# Patient Record
Sex: Male | Born: 1985 | Race: White | Hispanic: No | Smoking: Light tobacco smoker
Health system: Southern US, Community
[De-identification: ages and names within clinical notes are randomized; demographics above are authoritative.]

## PROBLEM LIST (undated history)

## (undated) DIAGNOSIS — C6290 Malignant neoplasm of unspecified testis, unspecified whether descended or undescended: Secondary | ICD-10-CM

## (undated) HISTORY — PX: TESTICLE REMOVAL: SHX68

---

## 2019-01-23 ENCOUNTER — Emergency Department (HOSPITAL_COMMUNITY)
Admission: EM | Admit: 2019-01-23 | Discharge: 2019-01-23 | Disposition: A | Discharge status: Home / Self Care | Payer: Medicaid - Out of State | Attending: Emergency Medicine | Admitting: Emergency Medicine

## 2019-01-23 ENCOUNTER — Other Ambulatory Visit: Payer: Self-pay

## 2019-01-23 ENCOUNTER — Encounter (HOSPITAL_COMMUNITY): Payer: Self-pay | Admitting: Emergency Medicine

## 2019-01-23 DIAGNOSIS — X58XXXA Exposure to other specified factors, initial encounter: Secondary | ICD-10-CM | POA: Diagnosis not present

## 2019-01-23 DIAGNOSIS — F17210 Nicotine dependence, cigarettes, uncomplicated: Secondary | ICD-10-CM | POA: Diagnosis not present

## 2019-01-23 DIAGNOSIS — Y929 Unspecified place or not applicable: Secondary | ICD-10-CM | POA: Diagnosis not present

## 2019-01-23 DIAGNOSIS — S3021XA Contusion of penis, initial encounter: Secondary | ICD-10-CM | POA: Diagnosis not present

## 2019-01-23 DIAGNOSIS — N4889 Other specified disorders of penis: Secondary | ICD-10-CM

## 2019-01-23 DIAGNOSIS — S3994XA Unspecified injury of external genitals, initial encounter: Secondary | ICD-10-CM | POA: Diagnosis present

## 2019-01-23 DIAGNOSIS — Y9389 Activity, other specified: Secondary | ICD-10-CM | POA: Insufficient documentation

## 2019-01-23 DIAGNOSIS — Y999 Unspecified external cause status: Secondary | ICD-10-CM | POA: Diagnosis not present

## 2019-01-23 LAB — URINALYSIS, ROUTINE W REFLEX MICROSCOPIC
Bilirubin Urine: NEGATIVE
Glucose, UA: NEGATIVE mg/dL
Hgb urine dipstick: NEGATIVE
Ketones, ur: NEGATIVE mg/dL
Leukocytes,Ua: NEGATIVE
Nitrite: NEGATIVE
Protein, ur: NEGATIVE mg/dL
Specific Gravity, Urine: 1.019 (ref 1.005–1.030)
pH: 6 (ref 5.0–8.0)

## 2019-01-23 NOTE — ED Provider Notes (Signed)
  [facility] EMERGENCY DEPARTMENT Provider Note   CSN: [id number] Arrival date & time: 01/23/19  1126     History   Chief Complaint No chief complaint on file.   HPI Ricky Mack is a 33 y.o. male.     Pt complains of swelling and bruising to his penis after rough sex.   The history is provided by the patient. No language interpreter was used.    History reviewed. No pertinent past medical history.  There are no active problems to display for this patient.   Past Surgical History:  Procedure Laterality Date  . TESTICLE REMOVAL Right         Home Medications    Prior to Admission medications   Not on File    Family History No family history on file.  Social History Social History   Tobacco Use  . Smoking status: Light Tobacco Smoker    Types: Cigarettes  . Smokeless tobacco: Never Used  Substance Use Topics  . Alcohol use: Yes    Comment: occ  . Drug use: Not Currently     Allergies   Keflex [cephalexin]   Review of Systems Review of Systems  All other systems reviewed and are negative.    Physical Exam Updated Vital Signs BP 126/80 (BP Location: Right Arm)   Pulse 73   Temp 97.9 F (36.6 C) (Oral)   Resp 16   Ht 6\' 2"  (1.88 m)   Wt 136.1 kg   SpO2 97%   BMI 38.52 kg/m   Physical Exam Vitals signs reviewed.  Cardiovascular:     Rate and Rhythm: Normal rate.  Pulmonary:     Effort: Pulmonary effort is normal.  Abdominal:     General: Abdomen is flat.  Genitourinary:    Comments: Bruised proximal shaft of penis, nontender, no bleeeding  Musculoskeletal: Normal range of motion.  Skin:    General: Skin is warm.  Neurological:     General: No focal deficit present.  Psychiatric:        Mood and Affect: Mood normal.      ED Treatments / Results  Labs (all labs ordered are listed, but only abnormal results are displayed) Labs Reviewed  URINALYSIS, ROUTINE W REFLEX MICROSCOPIC    EKG None  Radiology No results  found.  Procedures Procedures (including critical care time)  Medications Ordered in ED Medications - No data to display   Initial Impression / Assessment and Plan / ED Course  I have reviewed the triage vital signs and the nursing notes.  Pertinent labs & imaging results that were available during my care of the patient were reviewed by me and considered in my medical decision making (see chart for details).        MDM  ua negative.  I spoke to Dr. Alinda Money urology.  Pt did not have sudden pop, no loss of erection.  Minimal pain.   I doubt penile fracture.  Pt advised to follow up with Urology if any problems.   Final Clinical Impressions(s) / ED Diagnoses   Final diagnoses:  Penile pain    ED Discharge Orders    None    An After Visit Summary was printed and given to the patient.    Fransico Meadow, PA-C 01/23/19 1709    Fredia Sorrow, MD 01/24/19 [phone]

## 2019-01-23 NOTE — ED Triage Notes (Signed)
Pt states his penis is black and blue. States "I had a little too much fun last night."  Denies any difficulty urinating.

## 2019-01-23 NOTE — ED Notes (Signed)
Pt is aware a urine sample is needed, pt stated he cannot go at this time. Urinal and call bell at bedside.

## 2019-01-23 NOTE — Discharge Instructions (Addendum)
Follow up with Urology if any difficulty with intercourse or any curving of penis.   No sex x 1 week.  Ice 20 minutes 4 times a day today  tylenol for pain

## 2019-03-26 ENCOUNTER — Emergency Department (HOSPITAL_COMMUNITY)
Admission: EM | Admit: 2019-03-26 | Discharge: 2019-03-26 | Disposition: A | Discharge status: Home / Self Care | Payer: Medicaid - Out of State | Attending: Emergency Medicine | Admitting: Emergency Medicine

## 2019-03-26 ENCOUNTER — Other Ambulatory Visit: Payer: Self-pay

## 2019-03-26 ENCOUNTER — Encounter (HOSPITAL_COMMUNITY): Payer: Self-pay | Admitting: Emergency Medicine

## 2019-03-26 ENCOUNTER — Emergency Department (HOSPITAL_COMMUNITY): Payer: Medicaid - Out of State

## 2019-03-26 DIAGNOSIS — Z8547 Personal history of malignant neoplasm of testis: Secondary | ICD-10-CM | POA: Diagnosis not present

## 2019-03-26 DIAGNOSIS — M79644 Pain in right finger(s): Secondary | ICD-10-CM | POA: Diagnosis present

## 2019-03-26 DIAGNOSIS — L03011 Cellulitis of right finger: Secondary | ICD-10-CM | POA: Insufficient documentation

## 2019-03-26 DIAGNOSIS — F17210 Nicotine dependence, cigarettes, uncomplicated: Secondary | ICD-10-CM | POA: Insufficient documentation

## 2019-03-26 DIAGNOSIS — M779 Enthesopathy, unspecified: Secondary | ICD-10-CM | POA: Insufficient documentation

## 2019-03-26 DIAGNOSIS — M778 Other enthesopathies, not elsewhere classified: Secondary | ICD-10-CM

## 2019-03-26 HISTORY — DX: Malignant neoplasm of unspecified testis, unspecified whether descended or undescended: C62.90

## 2019-03-26 MED ORDER — SULFAMETHOXAZOLE-TRIMETHOPRIM 800-160 MG PO TABS
1.0000 | ORAL_TABLET | Freq: Once | ORAL | Status: AC
  Administered 2019-03-26: 18:00:00 1 via ORAL
  Filled 2019-03-26: qty 1

## 2019-03-26 MED ORDER — SULFAMETHOXAZOLE-TRIMETHOPRIM 800-160 MG PO TABS: 1.0000 | ORAL_TABLET | Freq: Two times a day (BID) | ORAL | 0 refills | Status: AC

## 2019-03-26 MED ORDER — LIDOCAINE HCL (PF) 1 % IJ SOLN
INTRAMUSCULAR | Status: AC
  Administered 2019-03-26: 18:00:00
  Filled 2019-03-26: qty 5

## 2019-03-26 MED ORDER — ACETAMINOPHEN 500 MG PO TABS
1000.0000 mg | ORAL_TABLET | Freq: Once | ORAL | Status: AC
  Administered 2019-03-26: 1000 mg via ORAL
  Filled 2019-03-26: qty 2

## 2019-03-26 MED ORDER — ONDANSETRON HCL 4 MG PO TABS
4.0000 mg | ORAL_TABLET | Freq: Once | ORAL | Status: AC
  Administered 2019-03-26: 4 mg via ORAL
  Filled 2019-03-26: qty 1

## 2019-03-26 MED ORDER — IBUPROFEN 400 MG PO TABS
400.0000 mg | ORAL_TABLET | Freq: Once | ORAL | Status: AC
  Administered 2019-03-26: 18:00:00 400 mg via ORAL
  Filled 2019-03-26: qty 1

## 2019-03-26 MED ORDER — POVIDONE-IODINE 10 % EX SOLN
CUTANEOUS | Status: AC
  Administered 2019-03-26: 18:00:00
  Filled 2019-03-26: qty 15

## 2019-03-26 NOTE — ED Triage Notes (Signed)
Pain and reddness to RT middle finger x 3 days after a fingernail broke off.  Pain to LT shoulder w/ movement x 2 weeks.

## 2019-03-26 NOTE — ED Provider Notes (Signed)
[facility] Provider Note   CSN: [id number] Arrival date & time: 03/26/19  1609     History   Chief Complaint Chief Complaint  Patient presents with  . Hand Pain    HPI Ricky Mack is a 33 y.o. male.     Patient is a 33 year old male who presents to the emergency department with complaint of pain of the right middle finger and left shoulder pain.  The patient states that about 3 to 4 days ago he pulled a fingernail off that went rather deep.  He also says that he pulled a piece of the cuticle out.  He now has swelling and redness of the tip of the finger.  He says he has pain that is rather severe at the tip of the finger.  He is not had any fever or chills to be reported.  No red streaks going up the hand.  The patient denies any pus like drainage.  The patient reports a two-week history of pain with movement of the left shoulder.  He says it started when he got down on the ground to do something, and when he attempted to get up he had pain of his shoulder.  He says that he does a lot of repetitive movement with his shoulders and upper extremities.  He is not had any direct injury.  He has not had any falls, or been hit in the shoulder area.  He is not had any hot joints as far as the shoulders are concerned.  No recent operations or procedures involving the left shoulder.  He presents now for assistance with this pain.  The history is provided by the patient.  Hand Pain Pertinent negatives include no chest pain, no abdominal pain and no shortness of breath.    Past Medical History:  Diagnosis Date  . 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   Past Surgical History:  Procedure Laterality Date  . TESTICLE REMOVAL Right         Home Medications    Prior to Admission medications   Not on File    Family History No family history on file.  Social History Social History   Tobacco Use  . Smoking  status: Light Tobacco Smoker    Types: Cigarettes  . Smokeless tobacco: Never Used  Substance Use Topics  . Alcohol use: Yes    Comment: occ  . Drug use: Not Currently     Allergies   Keflex [cephalexin]   Review of Systems Review of Systems  Constitutional: Negative for activity change and appetite change.  HENT: Negative for congestion, ear discharge, ear pain, facial swelling, nosebleeds, rhinorrhea, sneezing and tinnitus.   Eyes: Negative for photophobia, pain and discharge.  Respiratory: Negative for cough, choking, shortness of breath and wheezing.   Cardiovascular: Negative for chest pain, palpitations and leg swelling.  Gastrointestinal: Negative for abdominal pain, blood in stool, constipation, diarrhea, nausea and vomiting.  Genitourinary: Negative for difficulty urinating, dysuria, flank pain, frequency and hematuria.  Musculoskeletal: Positive for arthralgias. Negative for back pain, gait problem, myalgias and neck pain.       Finger pain  Skin: Negative for color change, rash and wound.  Neurological: Negative for dizziness, seizures, syncope, facial asymmetry, speech difficulty, weakness and numbness.  Hematological: Negative for adenopathy. Does not bruise/bleed easily.  Psychiatric/Behavioral: Negative for agitation, confusion, hallucinations, self-injury and suicidal ideas. The patient is not nervous/anxious.  Physical Exam Updated Vital Signs BP 126/86 (BP Location: Right Arm)   Pulse 86   Temp 98.1 F (36.7 C) (Oral)   Resp 18   Ht 6\' 2"  (1.88 m)   Wt 135.2 kg   SpO2 98%   BMI 38.26 kg/m   Physical Exam Vitals signs and nursing note reviewed.  Constitutional:      Appearance: He is well-developed. He is not toxic-appearing.  HENT:     Head: Normocephalic.     Right Ear: Tympanic membrane and external ear normal.     Left Ear: Tympanic membrane and external ear normal.  Eyes:     General: Lids are normal.     Pupils: Pupils are equal,  round, and reactive to light.  Neck:     Musculoskeletal: Normal range of motion and neck supple.     Vascular: No carotid bruit.  Cardiovascular:     Rate and Rhythm: Normal rate and regular rhythm.     Pulses: Normal pulses.     Heart sounds: Normal heart sounds.  Pulmonary:     Effort: No respiratory distress.     Breath sounds: Normal breath sounds.  Abdominal:     General: Bowel sounds are normal.     Palpations: Abdomen is soft.     Tenderness: There is no abdominal tenderness. There is no guarding.  Musculoskeletal: Normal range of motion.     Comments: Patient noted to have a paronychia of the right middle finger.  There is no drainage at this time.  There is redness and tenderness of the tip of the finger.  There is full range of motion of the fingers of the right hand.  There is pain with attempted range of motion of the left shoulder.  There is no evidence for dislocation.  There is no hot joint appreciated.  There is full range of motion of the left elbow, wrist and fingers.  The radial pulses 2+.  Capillary refill is less than 2 seconds.  Lymphadenopathy:     Head:     Right side of head: No submandibular adenopathy.     Left side of head: No submandibular adenopathy.     Cervical: No cervical adenopathy.  Skin:    General: Skin is warm and dry.  Neurological:     Mental Status: He is alert and oriented to person, place, and time.     Cranial Nerves: No cranial nerve deficit.     Sensory: No sensory deficit.  Psychiatric:        Speech: Speech normal.      ED Treatments / Results  Labs (all labs ordered are listed, but only abnormal results are displayed) Labs Reviewed - No data to display  EKG None  Radiology No results found.  Procedures Drain paronychia  Date/Time: 03/26/2019 5:33 PM Performed by: Lily Kocher, PA-C Authorized by: Lily Kocher, PA-C  Consent: Verbal consent obtained. Risks and benefits: risks, benefits and alternatives were  discussed Consent given by: patient Patient understanding: patient states understanding of the procedure being performed Required items: required blood products, implants, devices, and special equipment available Patient identity confirmed: arm band Time out: Immediately prior to procedure a "time out" was called to verify the correct patient, procedure, equipment, support staff and site/side marked as required. Preparation: Patient was prepped and draped in the usual sterile fashion. Local anesthesia used: yes Anesthesia: digital block  Anesthesia: Local anesthesia used: yes Local Anesthetic: lidocaine 1% without epinephrine  Sedation: Patient sedated: no  Patient tolerance: patient tolerated the procedure well with no immediate complications    (including critical care time)  Medications Ordered in ED Medications  lidocaine (PF) (XYLOCAINE) 1 % injection (has no administration in time range)  povidone-iodine (BETADINE) 10 % external solution (has no administration in time range)     Initial Impression / Assessment and Plan / ED Course  I have reviewed the triage vital signs and the nursing notes.  Pertinent labs & imaging results that were available during my care of the patient were reviewed by me and considered in my medical decision making (see chart for details).          Final Clinical Impressions(s) / ED Diagnoses MDM  Vital signs within normal limits.  Pulse oximetry is 98% on room air.  Within normal limits by my interpretation.  Patient has a paronychia of the middle finger of the right hand.  Incision and drainage carried out.  X-ray of the left shoulder shows no fracture, no dislocation, and no arthropathy.  Patient is asked to use Tylenol and ibuprofen for the soreness.  Patient is referred to orthopedics for orthopedic evaluation of this increasing pain.   Final diagnoses:  Acute paronychia of finger, right  Left shoulder tendonitis    ED  Discharge Orders         Ordered    sulfamethoxazole-trimethoprim (BACTRIM DS) 800-160 MG tablet  2 times daily     03/26/19 1745           Lily Kocher, PA-C 03/26/19 1747    Varney Biles, MD 03/28/19 2259

## 2019-03-26 NOTE — Discharge Instructions (Addendum)
The x-ray of your shoulder is negative for fracture, dislocation, or calcium and your tendons.  Your examination is consistent with a tendinitis involving the shoulder.  Please use 1000 mg of Tylenol and 400 mg of ibuprofen with breakfast, lunch, dinner, and at bedtime.  Please make an appointment with Dr. Aline Brochure for orthopedic evaluation of your shoulder.  You had a collection of pus under the skin at the tip of your right middle finger.  This has been evacuated and drained.  Please cleanse the area daily with soap and water, and put on a clean dressing daily.  Please use Bactrim 2 times daily with food until all taken.  Please see your primary physician or return to the emergency department if any signs of red streaks going up your hand, pus like drainage from the wound, or signs of advancing infection.

## 2019-08-14 ENCOUNTER — Emergency Department (HOSPITAL_COMMUNITY): Payer: Medicaid Other

## 2019-08-14 ENCOUNTER — Emergency Department (HOSPITAL_COMMUNITY)
Admission: EM | Admit: 2019-08-14 | Discharge: 2019-08-14 | Disposition: A | Discharge status: Home / Self Care | Payer: Medicaid Other | Attending: Emergency Medicine | Admitting: Emergency Medicine

## 2019-08-14 ENCOUNTER — Other Ambulatory Visit: Payer: Self-pay

## 2019-08-14 ENCOUNTER — Encounter (HOSPITAL_COMMUNITY): Payer: Self-pay | Admitting: Emergency Medicine

## 2019-08-14 DIAGNOSIS — Y929 Unspecified place or not applicable: Secondary | ICD-10-CM | POA: Diagnosis not present

## 2019-08-14 DIAGNOSIS — Y939 Activity, unspecified: Secondary | ICD-10-CM | POA: Diagnosis not present

## 2019-08-14 DIAGNOSIS — Z8547 Personal history of malignant neoplasm of testis: Secondary | ICD-10-CM | POA: Diagnosis not present

## 2019-08-14 DIAGNOSIS — Z6841 Body Mass Index (BMI) 40.0 and over, adult: Secondary | ICD-10-CM | POA: Insufficient documentation

## 2019-08-14 DIAGNOSIS — S301XXA Contusion of abdominal wall, initial encounter: Secondary | ICD-10-CM | POA: Diagnosis present

## 2019-08-14 DIAGNOSIS — E669 Obesity, unspecified: Secondary | ICD-10-CM | POA: Diagnosis not present

## 2019-08-14 DIAGNOSIS — F17210 Nicotine dependence, cigarettes, uncomplicated: Secondary | ICD-10-CM | POA: Diagnosis not present

## 2019-08-14 DIAGNOSIS — M25551 Pain in right hip: Secondary | ICD-10-CM | POA: Insufficient documentation

## 2019-08-14 DIAGNOSIS — Y999 Unspecified external cause status: Secondary | ICD-10-CM | POA: Diagnosis not present

## 2019-08-14 DIAGNOSIS — M545 Low back pain: Secondary | ICD-10-CM | POA: Insufficient documentation

## 2019-08-14 DIAGNOSIS — W11XXXA Fall on and from ladder, initial encounter: Secondary | ICD-10-CM | POA: Diagnosis not present

## 2019-08-14 DIAGNOSIS — W19XXXA Unspecified fall, initial encounter: Secondary | ICD-10-CM

## 2019-08-14 DIAGNOSIS — T07XXXA Unspecified multiple injuries, initial encounter: Secondary | ICD-10-CM

## 2019-08-14 LAB — CBC WITH DIFFERENTIAL/PLATELET
Abs Immature Granulocytes: 0.07 10*3/uL (ref 0.00–0.07)
Basophils Absolute: 0.1 10*3/uL (ref 0.0–0.1)
Basophils Relative: 0 %
Eosinophils Absolute: 0 10*3/uL (ref 0.0–0.5)
Eosinophils Relative: 0 %
HCT: 46.8 % (ref 39.0–52.0)
Hemoglobin: 15.5 g/dL (ref 13.0–17.0)
Immature Granulocytes: 1 %
Lymphocytes Relative: 13 %
Lymphs Abs: 1.7 10*3/uL (ref 0.7–4.0)
MCH: 28.9 pg (ref 26.0–34.0)
MCHC: 33.1 g/dL (ref 30.0–36.0)
MCV: 87.3 fL (ref 80.0–100.0)
Monocytes Absolute: 0.8 10*3/uL (ref 0.1–1.0)
Monocytes Relative: 6 %
Neutro Abs: 10.4 10*3/uL — ABNORMAL HIGH (ref 1.7–7.7)
Neutrophils Relative %: 80 %
Platelets: 275 10*3/uL (ref 150–400)
RBC: 5.36 MIL/uL (ref 4.22–5.81)
RDW: 12.9 % (ref 11.5–15.5)
WBC: 13.1 10*3/uL — ABNORMAL HIGH (ref 4.0–10.5)
nRBC: 0 % (ref 0.0–0.2)

## 2019-08-14 LAB — COMPREHENSIVE METABOLIC PANEL
ALT: 38 U/L (ref 0–44)
AST: 23 U/L (ref 15–41)
Albumin: 3.9 g/dL (ref 3.5–5.0)
Alkaline Phosphatase: 42 U/L (ref 38–126)
Anion gap: 7 (ref 5–15)
BUN: 15 mg/dL (ref 6–20)
CO2: 25 mmol/L (ref 22–32)
Calcium: 8.9 mg/dL (ref 8.9–10.3)
Chloride: 105 mmol/L (ref 98–111)
Creatinine, Ser: 0.94 mg/dL (ref 0.61–1.24)
GFR calc Af Amer: >60 mL/min (ref >60)
GFR calc non Af Amer: >60 mL/min (ref >60)
Glucose, Bld: 144 mg/dL — ABNORMAL HIGH (ref 70–99)
Potassium: 4.3 mmol/L (ref 3.5–5.1)
Sodium: 137 mmol/L (ref 135–145)
Total Bilirubin: 0.6 mg/dL (ref 0.3–1.2)
Total Protein: 7.1 g/dL (ref 6.5–8.1)

## 2019-08-14 MED ORDER — METHOCARBAMOL 500 MG PO TABS: 500.0000 mg | ORAL_TABLET | Freq: Two times a day (BID) | ORAL | 0 refills | Status: DC

## 2019-08-14 MED ORDER — IBUPROFEN 800 MG PO TABS: 800.0000 mg | ORAL_TABLET | Freq: Three times a day (TID) | ORAL | 0 refills | Status: AC | PRN

## 2019-08-14 MED ORDER — IOHEXOL 300 MG/ML  SOLN
100.0000 mL | Freq: Once | INTRAMUSCULAR | Status: AC | PRN
  Administered 2019-08-14: 100 mL via INTRAVENOUS

## 2019-08-14 MED ORDER — HYDROMORPHONE HCL 1 MG/ML IJ SOLN
1.0000 mg | Freq: Once | INTRAMUSCULAR | Status: AC
  Administered 2019-08-14: 18:00:00 1 mg via INTRAVENOUS
  Filled 2019-08-14: qty 1

## 2019-08-14 MED ORDER — OXYCODONE-ACETAMINOPHEN 5-325 MG PO TABS
2.0000 | ORAL_TABLET | Freq: Once | ORAL | Status: AC
  Administered 2019-08-14: 22:00:00 2 via ORAL
  Filled 2019-08-14: qty 2

## 2019-08-14 MED ORDER — KETOROLAC TROMETHAMINE 30 MG/ML IJ SOLN
30.0000 mg | Freq: Once | INTRAMUSCULAR | Status: AC
  Administered 2019-08-14: 30 mg via INTRAVENOUS
  Filled 2019-08-14: qty 1

## 2019-08-14 NOTE — ED Triage Notes (Signed)
Fall 9 ft from ladder  IV by EMS 22 ga L arm  Complaint of low back pain  No numbness   No incontinence

## 2019-08-14 NOTE — ED Provider Notes (Signed)
[facility] EMERGENCY DEPARTMENT Provider Note   CSN: [id number] Arrival date & time: 08/14/19  1803     History Chief Complaint  Patient presents with  . Fall    Ricky Mack is a 34 y.o. male.  Patient brought in by EMS after fall from ladder.  He estimates he fell about 9 to 10 feet onto his right side onto a pile of lumbar.  This was a fall onto his right side complaining of severe pain to his right hip, flank and pelvis.  Denies hitting his head or losing consciousness.  Does not take any blood thinners.  Denies any head, neck, chest or abdominal pain.  Complains of right-sided low back pain, right flank and right hip.  No incontinence.  No focal weakness, numbness or tingling.  The history is provided by the patient.  Fall Pertinent negatives include no abdominal pain, no headaches and no shortness of breath.       Past Medical History:  Diagnosis Date  . 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   Past Surgical History:  Procedure Laterality Date  . TESTICLE REMOVAL Right        No family history on file.  Social History   Tobacco Use  . Smoking status: Light Tobacco Smoker    Types: Cigarettes  . Smokeless tobacco: Never Used  Substance Use Topics  . Alcohol use: Yes    Comment: occ  . Drug use: Not Currently    Home Medications Prior to Admission medications   Not on File    Allergies    Keflex [cephalexin]  Review of Systems   Review of Systems  Constitutional: Negative for activity change, appetite change and fever.  HENT: Negative for congestion and rhinorrhea.   Eyes: Negative for visual disturbance.  Respiratory: Negative for cough, chest tightness and shortness of breath.   Gastrointestinal: Negative for abdominal pain, nausea and vomiting.  Genitourinary: Negative for dysuria and hematuria.  Musculoskeletal: Positive for arthralgias, back pain and myalgias.  Neurological: Negative for dizziness,  weakness, light-headedness and headaches.   all other systems are negative except as noted in the HPI and PMH.    Physical Exam Updated Vital Signs BP 125/84   Pulse 92   Temp 97.6 F (36.4 C) (Oral)   Resp 19   Ht 5\' 10"  (1.778 m)   Wt 136.1 kg   SpO2 100%   BMI 43.05 kg/m   Physical Exam Vitals and nursing note reviewed.  Constitutional:      General: He is not in acute distress.    Appearance: He is well-developed. He is obese.  HENT:     Head: Normocephalic and atraumatic.     Mouth/Throat:     Pharynx: No oropharyngeal exudate.  Eyes:     Conjunctiva/sclera: Conjunctivae normal.     Pupils: Pupils are equal, round, and reactive to light.  Neck:     Comments: C-collar placed on arrival.  No midline tenderness or step-off. Cardiovascular:     Rate and Rhythm: Normal rate and regular rhythm.     Heart sounds: Normal heart sounds. No murmur.  Pulmonary:     Effort: Pulmonary effort is normal. No respiratory distress.     Breath sounds: Normal breath sounds.  Abdominal:     Palpations: Abdomen is soft.     Tenderness: There is no abdominal tenderness. There is no guarding or rebound.     Comments: Ecchymosis to  right flank.  Genitourinary:    Comments: Normal rectal tone. Musculoskeletal:        General: Tenderness present. Normal range of motion.     Cervical back: Normal range of motion.     Comments: Paraspinal lumbar tenderness on the right.  No midline T or L-spine tenderness  Pelvis stable.  Diffuse tenderness to right lateral hip  Equal strength in lower extremities.  Intact DP and PT pulse Ankle flexion extension intact.  Great toe extension intact +2 patellar reflexes bilaterally.  Skin:    General: Skin is warm.     Capillary Refill: Capillary refill takes less than 2 seconds.  Neurological:     General: No focal deficit present.     Mental Status: He is alert and oriented to person, place, and time.     Cranial Nerves: No cranial nerve deficit.       Motor: No abnormal muscle tone.     Coordination: Coordination normal.     Comments: No ataxia on finger to nose bilaterally. No pronator drift. 5/5 strength throughout. CN 2-12 intact.Equal grip strength. Sensation intact.   Psychiatric:        Behavior: Behavior normal.     ED Results / Procedures / Treatments   Labs (all labs ordered are listed, but only abnormal results are displayed) Labs Reviewed  CBC WITH DIFFERENTIAL/PLATELET - Abnormal; Notable for the following components:      Result Value   WBC 13.1 (*)    Neutro Abs 10.4 (*)    All other components within normal limits  COMPREHENSIVE METABOLIC PANEL - Abnormal; Notable for the following components:   Glucose, Bld 144 (*)    All other components within normal limits    EKG None  Radiology CT Head Wo Contrast  Result Date: 08/14/2019 CLINICAL DATA:  Status post trauma. EXAM: CT HEAD WITHOUT CONTRAST CT CERVICAL SPINE WITHOUT CONTRAST TECHNIQUE: Multidetector CT imaging of the head and cervical spine was performed following the standard protocol without intravenous contrast. Multiplanar CT image reconstructions of the cervical spine were also generated. COMPARISON:  None. FINDINGS: CT HEAD FINDINGS Brain: No evidence of acute infarction, hemorrhage, hydrocephalus, extra-axial collection or mass lesion/mass effect. Vascular: No hyperdense vessel or unexpected calcification. Skull: Normal. Negative for fracture or focal lesion. Sinuses/Orbits: No acute finding. Other: None. CT CERVICAL SPINE FINDINGS Alignment: Normal. Skull base and vertebrae: No acute fracture. No primary bone lesion or focal pathologic process. Soft tissues and spinal canal: No prevertebral fluid or swelling. No visible canal hematoma. Disc levels: C2-3: Normal endplates. Normal disc height and morphology. Normal bilateral uncovertebral and apophyseal joints. Normal central canal and intervertebral neuroforamina. C3-4: Normal endplates. Normal disc height  and morphology. Normal bilateral uncovertebral and apophyseal joints. Normal central canal and intervertebral neuroforamina. C4-5: Normal endplates. Normal disc height and morphology. Normal bilateral uncovertebral and apophyseal joints. Normal central canal and intervertebral neuroforamina. C5-6: Normal endplates. Normal disc height and morphology. Normal bilateral uncovertebral and apophyseal joints. Normal central canal and intervertebral neuroforamina. C6-7: Normal endplates. Normal disc height and morphology. Normal bilateral uncovertebral and apophyseal joints. Normal central canal and intervertebral neuroforamina. C7-T1: Normal endplates. Normal disc height and morphology. Normal bilateral uncovertebral and apophyseal joints. Normal central canal and intervertebral neuroforamina. Upper chest: Negative. Other: N/A IMPRESSION: 1. No evidence of acute traumatic injury to the brain. 2. No evidence of acute traumatic injury to the cervical spine. Electronically Signed   By: Virgina Norfolk M.D.   On: 08/14/2019 21:06   CT  Chest W Contrast  Result Date: 08/14/2019 CLINICAL DATA:  Status post trauma. EXAM: CT CHEST, ABDOMEN, AND PELVIS WITH CONTRAST TECHNIQUE: Multidetector CT imaging of the chest, abdomen and pelvis was performed following the standard protocol during bolus administration of intravenous contrast. CONTRAST:  183mL OMNIPAQUE IOHEXOL 300 MG/ML  SOLN COMPARISON:  None. FINDINGS: CT CHEST FINDINGS Cardiovascular: No significant vascular findings. Normal heart size. No pericardial effusion. Mediastinum/Nodes: No enlarged mediastinal, hilar, or axillary lymph nodes. Thyroid gland, trachea, and esophagus demonstrate no significant findings. Lungs/Pleura: Lungs are clear. No pleural effusion or pneumothorax. Musculoskeletal: No chest wall mass or suspicious bone lesions identified. CT ABDOMEN PELVIS FINDINGS Hepatobiliary: No focal liver abnormality is seen. No gallstones, gallbladder wall thickening,  or biliary dilatation. Pancreas: Unremarkable. No pancreatic ductal dilatation or surrounding inflammatory changes. Spleen: Normal in size without focal abnormality. Adrenals/Urinary Tract: Adrenal glands are unremarkable. Kidneys are normal, without renal calculi, focal lesion, or hydronephrosis. Bladder is unremarkable. Stomach/Bowel: Stomach is within normal limits. Appendix appears normal. No evidence of bowel wall thickening, distention, or inflammatory changes. Vascular/Lymphatic: No significant vascular findings are present. No enlarged abdominal or pelvic lymph nodes. Reproductive: Prostate is unremarkable. Other: No abdominal wall hernia or abnormality. No abdominopelvic ascites. Musculoskeletal: No acute or significant osseous findings. IMPRESSION: 1. No evidence of acute or active cardiopulmonary disease. 2. No evidence of acute or active process within the abdomen or pelvis. Electronically Signed   By: Virgina Norfolk M.D.   On: 08/14/2019 21:14   CT Cervical Spine Wo Contrast  Result Date: 08/14/2019 CLINICAL DATA:  Status post trauma. EXAM: CT HEAD WITHOUT CONTRAST CT CERVICAL SPINE WITHOUT CONTRAST TECHNIQUE: Multidetector CT imaging of the head and cervical spine was performed following the standard protocol without intravenous contrast. Multiplanar CT image reconstructions of the cervical spine were also generated. COMPARISON:  None. FINDINGS: CT HEAD FINDINGS Brain: No evidence of acute infarction, hemorrhage, hydrocephalus, extra-axial collection or mass lesion/mass effect. Vascular: No hyperdense vessel or unexpected calcification. Skull: Normal. Negative for fracture or focal lesion. Sinuses/Orbits: No acute finding. Other: None. CT CERVICAL SPINE FINDINGS Alignment: Normal. Skull base and vertebrae: No acute fracture. No primary bone lesion or focal pathologic process. Soft tissues and spinal canal: No prevertebral fluid or swelling. No visible canal hematoma. Disc levels: C2-3: Normal  endplates. Normal disc height and morphology. Normal bilateral uncovertebral and apophyseal joints. Normal central canal and intervertebral neuroforamina. C3-4: Normal endplates. Normal disc height and morphology. Normal bilateral uncovertebral and apophyseal joints. Normal central canal and intervertebral neuroforamina. C4-5: Normal endplates. Normal disc height and morphology. Normal bilateral uncovertebral and apophyseal joints. Normal central canal and intervertebral neuroforamina. C5-6: Normal endplates. Normal disc height and morphology. Normal bilateral uncovertebral and apophyseal joints. Normal central canal and intervertebral neuroforamina. C6-7: Normal endplates. Normal disc height and morphology. Normal bilateral uncovertebral and apophyseal joints. Normal central canal and intervertebral neuroforamina. C7-T1: Normal endplates. Normal disc height and morphology. Normal bilateral uncovertebral and apophyseal joints. Normal central canal and intervertebral neuroforamina. Upper chest: Negative. Other: N/A IMPRESSION: 1. No evidence of acute traumatic injury to the brain. 2. No evidence of acute traumatic injury to the cervical spine. Electronically Signed   By: Virgina Norfolk M.D.   On: 08/14/2019 21:06   CT ABDOMEN PELVIS W CONTRAST  Result Date: 08/14/2019 CLINICAL DATA:  Status post trauma. EXAM: CT CHEST, ABDOMEN, AND PELVIS WITH CONTRAST TECHNIQUE: Multidetector CT imaging of the chest, abdomen and pelvis was performed following the standard protocol during bolus administration of intravenous contrast. CONTRAST:  141mL OMNIPAQUE IOHEXOL 300 MG/ML  SOLN COMPARISON:  None. FINDINGS: CT CHEST FINDINGS Cardiovascular: No significant vascular findings. Normal heart size. No pericardial effusion. Mediastinum/Nodes: No enlarged mediastinal, hilar, or axillary lymph nodes. Thyroid gland, trachea, and esophagus demonstrate no significant findings. Lungs/Pleura: Lungs are clear. No pleural effusion or  pneumothorax. Musculoskeletal: No chest wall mass or suspicious bone lesions identified. CT ABDOMEN PELVIS FINDINGS Hepatobiliary: No focal liver abnormality is seen. No gallstones, gallbladder wall thickening, or biliary dilatation. Pancreas: Unremarkable. No pancreatic ductal dilatation or surrounding inflammatory changes. Spleen: Normal in size without focal abnormality. Adrenals/Urinary Tract: Adrenal glands are unremarkable. Kidneys are normal, without renal calculi, focal lesion, or hydronephrosis. Bladder is unremarkable. Stomach/Bowel: Stomach is within normal limits. Appendix appears normal. No evidence of bowel wall thickening, distention, or inflammatory changes. Vascular/Lymphatic: No significant vascular findings are present. No enlarged abdominal or pelvic lymph nodes. Reproductive: Prostate is unremarkable. Other: No abdominal wall hernia or abnormality. No abdominopelvic ascites. Musculoskeletal: No acute or significant osseous findings. IMPRESSION: 1. No evidence of acute or active cardiopulmonary disease. 2. No evidence of acute or active process within the abdomen or pelvis. Electronically Signed   By: Virgina Norfolk M.D.   On: 08/14/2019 21:12   DG Pelvis Portable  Result Date: 08/14/2019 CLINICAL DATA:  Fall from ladder EXAM: PORTABLE PELVIS 1-2 VIEWS COMPARISON:  None. FINDINGS: There is no evidence of pelvic fracture or diastasis. No pelvic bone lesions are seen. IMPRESSION: No evidence of pelvic fracture or diastasis. Electronically Signed   By: Davina Poke D.O.   On: 08/14/2019 19:05   DG Chest Portable 1 View  Result Date: 08/14/2019 CLINICAL DATA:  Fall from ladder EXAM: PORTABLE CHEST 1 VIEW COMPARISON:  None. FINDINGS: The heart size and mediastinal contours are within normal limits. No focal airspace consolidation, pleural effusion, or pneumothorax. Question nondisplaced fracture of the lateral aspect of the right second rib. IMPRESSION: 1. No acute cardiopulmonary  findings. 2. Question nondisplaced fracture of the lateral aspect of the right second rib. Attention on forthcoming CT. Electronically Signed   By: Davina Poke D.O.   On: 08/14/2019 19:04   DG Shoulder Right Portable  Result Date: 08/14/2019 CLINICAL DATA:  Fall from ladder. EXAM: PORTABLE RIGHT SHOULDER COMPARISON:  None. FINDINGS: Limited views of the right chest are normal. No fracture or dislocation. IMPRESSION: Negative. Electronically Signed   By: Dorise Bullion III M.D   On: 08/14/2019 19:06    Procedures Procedures (including critical care time)  Medications Ordered in ED Medications  HYDROmorphone (DILAUDID) injection 1 mg (1 mg Intravenous Given 08/14/19 1829)    ED Course  I have reviewed the triage vital signs and the nursing notes.  Pertinent labs & imaging results that were available during my care of the patient were reviewed by me and considered in my medical decision making (see chart for details).    MDM Rules/Calculators/A&P                      Fall from ladder approximately 10 feet.  GCS 15, ABCs intact.  No midline back pain but does have tenderness to the right side and right pelvis.  Equal strength in lower extremities.  Chest x-ray negative, pelvis x-ray negative.  Given mechanism of fall, will proceed with traumatic CT imaging.  Patient has no weakness, numbness, tingling.  No bowel or bladder incontinence.  Low suspicion for cord compression or cauda equina.  Traumatic imaging is negative.  Patient is able to  tolerate p.o. and ambulate. He has equal strength and sensation of lower extremities.  Discussed musculoskeletal soreness after fall.  He will be treated with anti-inflammatories and muscle relaxers.  Follow-up with PCP.  Return precautions discussed.  Final Clinical Impression(s) / ED Diagnoses Final diagnoses:  Fall, initial encounter  Multiple contusions    Rx / DC Orders ED Discharge Orders    None       Anothony Bursch, Annie Main,  MD 08/14/19 2240

## 2019-08-14 NOTE — ED Notes (Signed)
Pt had some difficulty getting out of bed because of being "sore and stiff." Once in sitting position, pt was able to stand and walk without assistance.

## 2019-08-14 NOTE — Discharge Instructions (Signed)
Your testing is reassuring.  There is no evidence of serious traumatic injury.  Take the anti-inflammatories muscle relaxers as prescribed.  Follow-up with your doctor.  Return to the ED if worsening pain, weakness, numbness, bowel or bladder incontinence, confusion or any other concerns.

## 2019-11-30 ENCOUNTER — Emergency Department (HOSPITAL_COMMUNITY)
Admission: EM | Admit: 2019-11-30 | Discharge: 2019-11-30 | Disposition: A | Discharge status: Home / Self Care | Payer: Medicaid Other | Attending: Emergency Medicine | Admitting: Emergency Medicine

## 2019-11-30 ENCOUNTER — Encounter (HOSPITAL_COMMUNITY): Payer: Self-pay | Admitting: Emergency Medicine

## 2019-11-30 ENCOUNTER — Other Ambulatory Visit: Payer: Self-pay

## 2019-11-30 DIAGNOSIS — Z79899 Other long term (current) drug therapy: Secondary | ICD-10-CM | POA: Diagnosis not present

## 2019-11-30 DIAGNOSIS — F17210 Nicotine dependence, cigarettes, uncomplicated: Secondary | ICD-10-CM | POA: Diagnosis not present

## 2019-11-30 DIAGNOSIS — L255 Unspecified contact dermatitis due to plants, except food: Secondary | ICD-10-CM | POA: Diagnosis not present

## 2019-11-30 DIAGNOSIS — R21 Rash and other nonspecific skin eruption: Secondary | ICD-10-CM | POA: Diagnosis present

## 2019-11-30 MED ORDER — METHYLPREDNISOLONE SODIUM SUCC 125 MG IJ SOLR
125.0000 mg | Freq: Every day | INTRAMUSCULAR | Status: DC
  Administered 2019-11-30: 125 mg via INTRAMUSCULAR
  Filled 2019-11-30: qty 2

## 2019-11-30 MED ORDER — PREDNISONE 10 MG PO TABS: ORAL_TABLET | ORAL | 0 refills | Status: DC

## 2019-11-30 NOTE — ED Triage Notes (Signed)
Pt c/o rash to bilateral arms, chest, head, neck, face and genitals x 1 week; pt reports similar reaction to poison ivy in the past, has no PCP locally, no relief with OTC topical ointments

## 2019-11-30 NOTE — ED Provider Notes (Signed)
[facility] EMERGENCY DEPARTMENT Provider Note   CSN: [id number] Arrival date & time: 11/30/19  1525     History Chief Complaint  Patient presents with  . Rash    Ricky Mack is a 34 y.o. male.  The history is provided by the patient. No language interpreter was used.  Rash Location:  Full body Quality: itchiness and redness   Severity:  Moderate Onset quality:  Gradual Timing:  Constant Progression:  Worsening Chronicity:  New Relieved by:  Nothing Worsened by:  Nothing Ineffective treatments:  None tried  Pt reports he has been exposed to poison sumac.  Pt complains of a rash    Past Medical History:  Diagnosis Date  . 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   Past Surgical History:  Procedure Laterality Date  . TESTICLE REMOVAL Right        No family history on file.  Social History   Tobacco Use  . Smoking status: Light Tobacco Smoker    Types: Cigarettes  . Smokeless tobacco: Never Used  Substance Use Topics  . Alcohol use: Yes    Comment: occ  . Drug use: Not Currently    Home Medications Prior to Admission medications   Medication Sig Start Date End Date Taking? Authorizing Provider  ibuprofen (ADVIL) 800 MG tablet Take 1 tablet (800 mg total) by mouth every 8 (eight) hours as needed for moderate pain. 08/14/19   Rancour, Annie Main, MD  methocarbamol (ROBAXIN) 500 MG tablet Take 1 tablet (500 mg total) by mouth 2 (two) times daily. 08/14/19   Rancour, Annie Main, MD  predniSONE (DELTASONE) 10 MG tablet 6,6,5,5,4,4,3,3,2,2,1,1 taper 11/30/19   Fransico Meadow, PA-C    Allergies    Keflex [cephalexin]  Review of Systems   Review of Systems  Skin: Positive for rash.  All other systems reviewed and are negative.   Physical Exam Updated Vital Signs BP 119/77 (BP Location: Right Arm)   Pulse 74   Temp 98.2 F (36.8 C) (Oral)   Resp 16   Ht 6\' 2"  (1.88 m)   Wt (!) 147 kg   SpO2 98%   BMI 41.60 kg/m    Physical Exam Vitals and nursing note reviewed.  Constitutional:      Appearance: He is well-developed.  HENT:     Head: Normocephalic.  Pulmonary:     Effort: Pulmonary effort is normal.  Musculoskeletal:        General: Normal range of motion.     Cervical back: Normal range of motion.  Skin:    Findings: Erythema and rash present.     Comments: Red raised rash, looks like contact   Neurological:     Mental Status: He is alert and oriented to person, place, and time.     ED Results / Procedures / Treatments   Labs (all labs ordered are listed, but only abnormal results are displayed) Labs Reviewed - No data to display  EKG None  Radiology No results found.  Procedures Procedures (including critical care time)  Medications Ordered in ED Medications  methylPREDNISolone sodium succinate (SOLU-MEDROL) 125 mg/2 mL injection 125 mg (has no administration in time range)    ED Course  I have reviewed the triage vital signs and the nursing notes.  Pertinent labs & imaging results that were available during my care of the patient were reviewed by me and considered in my medical decision making (see chart for details).  MDM Rules/Calculators/A&P                      MDM  Pt given solumedrol im, rx for prednisone taper Final Clinical Impression(s) / ED Diagnoses Final diagnoses:  Plant dermatitis    Rx / DC Orders ED Discharge Orders         Ordered    predniSONE (DELTASONE) 10 MG tablet    Note to Pharmacy: Please provide dosepack or dosepack instructions   11/30/19 1652           Sidney Ace 11/30/19 1716    Margette Fast, MD 12/01/19 1244

## 2020-02-14 ENCOUNTER — Other Ambulatory Visit: Payer: Self-pay

## 2020-02-14 ENCOUNTER — Encounter (HOSPITAL_COMMUNITY): Payer: Self-pay

## 2020-02-14 DIAGNOSIS — Z5321 Procedure and treatment not carried out due to patient leaving prior to being seen by health care provider: Secondary | ICD-10-CM | POA: Insufficient documentation

## 2020-02-14 DIAGNOSIS — Y929 Unspecified place or not applicable: Secondary | ICD-10-CM | POA: Insufficient documentation

## 2020-02-14 DIAGNOSIS — S40861A Insect bite (nonvenomous) of right upper arm, initial encounter: Secondary | ICD-10-CM | POA: Diagnosis present

## 2020-02-14 DIAGNOSIS — Y999 Unspecified external cause status: Secondary | ICD-10-CM | POA: Insufficient documentation

## 2020-02-14 DIAGNOSIS — W57XXXA Bitten or stung by nonvenomous insect and other nonvenomous arthropods, initial encounter: Secondary | ICD-10-CM | POA: Diagnosis not present

## 2020-02-14 DIAGNOSIS — Y939 Activity, unspecified: Secondary | ICD-10-CM | POA: Diagnosis not present

## 2020-02-14 NOTE — ED Triage Notes (Signed)
Pt states he has a bug bit on the inner upper area of his right arm. Noticed a couple hours ago. States that he is noticing his arm tingle.

## 2020-02-15 ENCOUNTER — Emergency Department (HOSPITAL_COMMUNITY)
Admission: EM | Admit: 2020-02-15 | Discharge: 2020-02-15 | Disposition: A | Discharge status: Home / Self Care | Payer: Medicaid Other | Attending: Emergency Medicine | Admitting: Emergency Medicine

## 2020-02-19 ENCOUNTER — Other Ambulatory Visit: Payer: Self-pay

## 2020-02-19 ENCOUNTER — Encounter (HOSPITAL_COMMUNITY): Payer: Self-pay | Admitting: Emergency Medicine

## 2020-02-19 ENCOUNTER — Encounter (HOSPITAL_COMMUNITY)
Admission: EM | Disposition: A | Discharge status: Home / Self Care | Payer: Self-pay | Source: Home / Self Care | Attending: Emergency Medicine

## 2020-02-19 ENCOUNTER — Observation Stay (HOSPITAL_COMMUNITY)
Admission: EM | Admit: 2020-02-19 | Discharge: 2020-02-19 | Disposition: A | Discharge status: Home / Self Care | Payer: Medicaid Other | Attending: General Surgery | Admitting: General Surgery

## 2020-02-19 ENCOUNTER — Observation Stay (HOSPITAL_COMMUNITY): Payer: Medicaid Other | Admitting: Anesthesiology

## 2020-02-19 ENCOUNTER — Emergency Department (HOSPITAL_COMMUNITY): Payer: Medicaid Other

## 2020-02-19 DIAGNOSIS — K353 Acute appendicitis with localized peritonitis, without perforation or gangrene: Secondary | ICD-10-CM | POA: Diagnosis not present

## 2020-02-19 DIAGNOSIS — Z20822 Contact with and (suspected) exposure to covid-19: Secondary | ICD-10-CM | POA: Diagnosis not present

## 2020-02-19 DIAGNOSIS — Z8547 Personal history of malignant neoplasm of testis: Secondary | ICD-10-CM | POA: Diagnosis not present

## 2020-02-19 DIAGNOSIS — F17210 Nicotine dependence, cigarettes, uncomplicated: Secondary | ICD-10-CM | POA: Insufficient documentation

## 2020-02-19 DIAGNOSIS — R1011 Right upper quadrant pain: Secondary | ICD-10-CM | POA: Diagnosis present

## 2020-02-19 DIAGNOSIS — K3580 Unspecified acute appendicitis: Secondary | ICD-10-CM

## 2020-02-19 HISTORY — PX: LAPAROSCOPIC APPENDECTOMY: SHX408

## 2020-02-19 LAB — CBC
HCT: 46.3 % (ref 39.0–52.0)
Hemoglobin: 15.4 g/dL (ref 13.0–17.0)
MCH: 28.7 pg (ref 26.0–34.0)
MCHC: 33.3 g/dL (ref 30.0–36.0)
MCV: 86.2 fL (ref 80.0–100.0)
Platelets: 260 10*3/uL (ref 150–400)
RBC: 5.37 MIL/uL (ref 4.22–5.81)
RDW: 13.2 % (ref 11.5–15.5)
WBC: 9 10*3/uL (ref 4.0–10.5)
nRBC: 0 % (ref 0.0–0.2)

## 2020-02-19 LAB — COMPREHENSIVE METABOLIC PANEL
ALT: 35 U/L (ref 0–44)
AST: 22 U/L (ref 15–41)
Albumin: 4.4 g/dL (ref 3.5–5.0)
Alkaline Phosphatase: 45 U/L (ref 38–126)
Anion gap: 7 (ref 5–15)
BUN: 18 mg/dL (ref 6–20)
CO2: 25 mmol/L (ref 22–32)
Calcium: 9.3 mg/dL (ref 8.9–10.3)
Chloride: 106 mmol/L (ref 98–111)
Creatinine, Ser: 1.04 mg/dL (ref 0.61–1.24)
GFR calc Af Amer: >60 mL/min (ref >60)
GFR calc non Af Amer: >60 mL/min (ref >60)
Glucose, Bld: 102 mg/dL — ABNORMAL HIGH (ref 70–99)
Potassium: 3.7 mmol/L (ref 3.5–5.1)
Sodium: 138 mmol/L (ref 135–145)
Total Bilirubin: 0.4 mg/dL (ref 0.3–1.2)
Total Protein: 7.8 g/dL (ref 6.5–8.1)

## 2020-02-19 LAB — URINALYSIS, ROUTINE W REFLEX MICROSCOPIC
Bilirubin Urine: NEGATIVE
Glucose, UA: NEGATIVE mg/dL
Hgb urine dipstick: NEGATIVE
Ketones, ur: NEGATIVE mg/dL
Leukocytes,Ua: NEGATIVE
Nitrite: NEGATIVE
Protein, ur: NEGATIVE mg/dL
Specific Gravity, Urine: >1.046 — ABNORMAL HIGH (ref 1.005–1.030)
pH: 5 (ref 5.0–8.0)

## 2020-02-19 LAB — SARS CORONAVIRUS 2 BY RT PCR (HOSPITAL ORDER, PERFORMED IN ~~LOC~~ HOSPITAL LAB): SARS Coronavirus 2: NEGATIVE

## 2020-02-19 LAB — HIV ANTIBODY (ROUTINE TESTING W REFLEX): HIV Screen 4th Generation wRfx: NONREACTIVE

## 2020-02-19 LAB — LIPASE, BLOOD: Lipase: 29 U/L (ref 11–51)

## 2020-02-19 SURGERY — APPENDECTOMY, LAPAROSCOPIC
Anesthesia: General

## 2020-02-19 MED ORDER — PANTOPRAZOLE SODIUM 40 MG IV SOLR
40.0000 mg | Freq: Once | INTRAVENOUS | Status: AC
  Administered 2020-02-19: 40 mg via INTRAVENOUS
  Filled 2020-02-19: qty 40

## 2020-02-19 MED ORDER — ROCURONIUM BROMIDE 10 MG/ML (PF) SYRINGE
PREFILLED_SYRINGE | INTRAVENOUS | Status: DC | PRN
  Administered 2020-02-19: 50 mg via INTRAVENOUS

## 2020-02-19 MED ORDER — MORPHINE SULFATE (PF) 2 MG/ML IV SOLN: 2.0000 mg | INTRAVENOUS | Status: DC | PRN

## 2020-02-19 MED ORDER — OXYCODONE HCL 5 MG PO TABS: 5.0000 mg | ORAL_TABLET | ORAL | Status: DC | PRN

## 2020-02-19 MED ORDER — CHLORHEXIDINE GLUCONATE CLOTH 2 % EX PADS: 6.0000 | MEDICATED_PAD | Freq: Once | CUTANEOUS | Status: DC

## 2020-02-19 MED ORDER — SUGAMMADEX SODIUM 200 MG/2ML IV SOLN
INTRAVENOUS | Status: DC | PRN
  Administered 2020-02-19: 300 mg via INTRAVENOUS

## 2020-02-19 MED ORDER — PROPOFOL 10 MG/ML IV BOLUS
INTRAVENOUS | Status: AC
  Filled 2020-02-19: qty 20

## 2020-02-19 MED ORDER — PROPOFOL 10 MG/ML IV BOLUS
INTRAVENOUS | Status: DC | PRN
  Administered 2020-02-19: 250 mg via INTRAVENOUS
  Administered 2020-02-19: 50 mg via INTRAVENOUS

## 2020-02-19 MED ORDER — ONDANSETRON HCL 4 MG/2ML IJ SOLN: 4.0000 mg | Freq: Four times a day (QID) | INTRAMUSCULAR | Status: DC | PRN

## 2020-02-19 MED ORDER — SODIUM CHLORIDE 0.9 % IR SOLN
Status: DC | PRN
  Administered 2020-02-19: 1000 mL

## 2020-02-19 MED ORDER — LACTATED RINGERS IV BOLUS
1000.0000 mL | Freq: Once | INTRAVENOUS | Status: AC
  Administered 2020-02-19: 1000 mL via INTRAVENOUS

## 2020-02-19 MED ORDER — CHLORHEXIDINE GLUCONATE 0.12 % MT SOLN: 15.0000 mL | Freq: Once | OROMUCOSAL | Status: DC

## 2020-02-19 MED ORDER — SIMETHICONE 80 MG PO CHEW: 40.0000 mg | CHEWABLE_TABLET | Freq: Four times a day (QID) | ORAL | Status: DC | PRN

## 2020-02-19 MED ORDER — ONDANSETRON 4 MG PO TBDP: 4.0000 mg | ORAL_TABLET | Freq: Four times a day (QID) | ORAL | Status: DC | PRN

## 2020-02-19 MED ORDER — DIPHENHYDRAMINE HCL 50 MG/ML IJ SOLN: 12.5000 mg | Freq: Four times a day (QID) | INTRAMUSCULAR | Status: DC | PRN

## 2020-02-19 MED ORDER — ALUM & MAG HYDROXIDE-SIMETH 200-200-20 MG/5ML PO SUSP
15.0000 mL | Freq: Once | ORAL | Status: AC
  Administered 2020-02-19: 15 mL via ORAL
  Filled 2020-02-19: qty 30

## 2020-02-19 MED ORDER — ORAL CARE MOUTH RINSE: 15.0000 mL | Freq: Once | OROMUCOSAL | Status: DC

## 2020-02-19 MED ORDER — BUPIVACAINE LIPOSOME 1.3 % IJ SUSP
INTRAMUSCULAR | Status: AC
  Filled 2020-02-19: qty 10

## 2020-02-19 MED ORDER — LIDOCAINE HCL URETHRAL/MUCOSAL 2 % EX GEL
CUTANEOUS | Status: AC
  Filled 2020-02-19: qty 30

## 2020-02-19 MED ORDER — ACETAMINOPHEN 325 MG PO TABS: 650.0000 mg | ORAL_TABLET | Freq: Four times a day (QID) | ORAL | Status: DC | PRN

## 2020-02-19 MED ORDER — SUCCINYLCHOLINE CHLORIDE 200 MG/10ML IV SOSY
PREFILLED_SYRINGE | INTRAVENOUS | Status: AC
  Filled 2020-02-19: qty 10

## 2020-02-19 MED ORDER — ONDANSETRON HCL 4 MG/2ML IJ SOLN
INTRAMUSCULAR | Status: DC | PRN
  Administered 2020-02-19: 4 mg via INTRAVENOUS

## 2020-02-19 MED ORDER — ENOXAPARIN SODIUM 40 MG/0.4ML ~~LOC~~ SOLN: 40.0000 mg | SUBCUTANEOUS | Status: DC

## 2020-02-19 MED ORDER — MIDAZOLAM HCL 2 MG/2ML IJ SOLN
INTRAMUSCULAR | Status: AC
  Filled 2020-02-19: qty 2

## 2020-02-19 MED ORDER — MIDAZOLAM HCL 5 MG/5ML IJ SOLN
INTRAMUSCULAR | Status: DC | PRN
  Administered 2020-02-19: 2 mg via INTRAVENOUS

## 2020-02-19 MED ORDER — BUPIVACAINE LIPOSOME 1.3 % IJ SUSP
INTRAMUSCULAR | Status: DC | PRN
  Administered 2020-02-19: 20 mL

## 2020-02-19 MED ORDER — DEXAMETHASONE SODIUM PHOSPHATE 10 MG/ML IJ SOLN
INTRAMUSCULAR | Status: AC
  Filled 2020-02-19: qty 1

## 2020-02-19 MED ORDER — HYDROMORPHONE HCL 1 MG/ML IJ SOLN
1.0000 mg | Freq: Once | INTRAMUSCULAR | Status: AC
  Administered 2020-02-19: 1 mg via INTRAVENOUS
  Filled 2020-02-19: qty 1

## 2020-02-19 MED ORDER — KETOROLAC TROMETHAMINE 30 MG/ML IJ SOLN
30.0000 mg | Freq: Four times a day (QID) | INTRAMUSCULAR | Status: DC | PRN
  Administered 2020-02-19: 30 mg via INTRAVENOUS
  Filled 2020-02-19: qty 1

## 2020-02-19 MED ORDER — LIDOCAINE 2% (20 MG/ML) 5 ML SYRINGE
INTRAMUSCULAR | Status: AC
  Filled 2020-02-19: qty 5

## 2020-02-19 MED ORDER — ONDANSETRON 4 MG PO TBDP: 4.0000 mg | ORAL_TABLET | Freq: Four times a day (QID) | ORAL | 0 refills | Status: AC | PRN

## 2020-02-19 MED ORDER — SODIUM CHLORIDE 0.9 % IV SOLN
INTRAVENOUS | Status: AC
  Filled 2020-02-19: qty 1

## 2020-02-19 MED ORDER — PROMETHAZINE HCL 25 MG/ML IJ SOLN: 6.2500 mg | INTRAMUSCULAR | Status: DC | PRN

## 2020-02-19 MED ORDER — ONDANSETRON HCL 4 MG/2ML IJ SOLN
INTRAMUSCULAR | Status: AC
  Filled 2020-02-19: qty 2

## 2020-02-19 MED ORDER — SUCCINYLCHOLINE CHLORIDE 200 MG/10ML IV SOSY
PREFILLED_SYRINGE | INTRAVENOUS | Status: DC | PRN
  Administered 2020-02-19: 160 mg via INTRAVENOUS

## 2020-02-19 MED ORDER — ROCURONIUM BROMIDE 10 MG/ML (PF) SYRINGE
PREFILLED_SYRINGE | INTRAVENOUS | Status: AC
  Filled 2020-02-19: qty 10

## 2020-02-19 MED ORDER — OXYCODONE HCL 5 MG PO TABS: 5.0000 mg | ORAL_TABLET | ORAL | 0 refills | Status: AC | PRN

## 2020-02-19 MED ORDER — PIPERACILLIN-TAZOBACTAM 3.375 G IVPB 30 MIN
3.3750 g | Freq: Once | INTRAVENOUS | Status: AC
  Administered 2020-02-19: 3.375 g via INTRAVENOUS
  Filled 2020-02-19: qty 50

## 2020-02-19 MED ORDER — HYDROMORPHONE HCL 1 MG/ML IJ SOLN
0.2500 mg | INTRAMUSCULAR | Status: DC | PRN
  Administered 2020-02-19 (×2): 0.5 mg via INTRAVENOUS
  Filled 2020-02-19 (×2): qty 0.5

## 2020-02-19 MED ORDER — LIDOCAINE HCL URETHRAL/MUCOSAL 2 % EX GEL
CUTANEOUS | Status: DC | PRN
  Administered 2020-02-19: 1 via INTRATRACHEAL

## 2020-02-19 MED ORDER — ACETAMINOPHEN 650 MG RE SUPP: 650.0000 mg | Freq: Four times a day (QID) | RECTAL | Status: DC | PRN

## 2020-02-19 MED ORDER — SODIUM CHLORIDE 0.9 % IV SOLN
1.0000 g | INTRAVENOUS | Status: AC
  Administered 2020-02-19: 1 g via INTRAVENOUS

## 2020-02-19 MED ORDER — LIDOCAINE HCL (CARDIAC) PF 100 MG/5ML IV SOSY
PREFILLED_SYRINGE | INTRAVENOUS | Status: DC | PRN
Start: 2020-02-19 — End: 2020-02-19
  Administered 2020-02-19: 100 mg via INTRATRACHEAL

## 2020-02-19 MED ORDER — METOPROLOL TARTRATE 5 MG/5ML IV SOLN: 5.0000 mg | Freq: Four times a day (QID) | INTRAVENOUS | Status: DC | PRN

## 2020-02-19 MED ORDER — LACTATED RINGERS IV SOLN: Freq: Once | INTRAVENOUS | Status: AC

## 2020-02-19 MED ORDER — FENTANYL CITRATE (PF) 250 MCG/5ML IJ SOLN
INTRAMUSCULAR | Status: DC | PRN
  Administered 2020-02-19 (×3): 50 ug via INTRAVENOUS
  Administered 2020-02-19: 100 ug via INTRAVENOUS

## 2020-02-19 MED ORDER — FENTANYL CITRATE (PF) 250 MCG/5ML IJ SOLN
INTRAMUSCULAR | Status: AC
  Filled 2020-02-19: qty 5

## 2020-02-19 MED ORDER — MEPERIDINE HCL 50 MG/ML IJ SOLN: 6.2500 mg | INTRAMUSCULAR | Status: DC | PRN

## 2020-02-19 MED ORDER — DEXAMETHASONE SODIUM PHOSPHATE 10 MG/ML IJ SOLN
INTRAMUSCULAR | Status: DC | PRN
  Administered 2020-02-19: 10 mg via INTRAVENOUS

## 2020-02-19 MED ORDER — DIPHENHYDRAMINE HCL 12.5 MG/5ML PO ELIX: 12.5000 mg | ORAL_SOLUTION | Freq: Four times a day (QID) | ORAL | Status: DC | PRN

## 2020-02-19 MED ORDER — IOHEXOL 300 MG/ML  SOLN
100.0000 mL | Freq: Once | INTRAMUSCULAR | Status: AC | PRN
  Administered 2020-02-19: 100 mL via INTRAVENOUS

## 2020-02-19 SURGICAL SUPPLY — 49 items
BAG RETRIEVAL 10 (BASKET) ×1
BAG RETRIEVAL 10MM (BASKET) ×1
BLADE SURG 15 STRL LF DISP TIS (BLADE) ×1 IMPLANT
BLADE SURG 15 STRL SS (BLADE) ×2
CHLORAPREP W/TINT 26 (MISCELLANEOUS) ×3 IMPLANT
CLOTH BEACON ORANGE TIMEOUT ST (SAFETY) ×3 IMPLANT
COVER LIGHT HANDLE STERIS (MISCELLANEOUS) ×6 IMPLANT
COVER WAND RF STERILE (DRAPES) ×3 IMPLANT
CUTTER FLEX LINEAR 45M (STAPLE) ×3 IMPLANT
DERMABOND ADVANCED (GAUZE/BANDAGES/DRESSINGS) ×2
DERMABOND ADVANCED .7 DNX12 (GAUZE/BANDAGES/DRESSINGS) ×1 IMPLANT
ELECT REM PT RETURN 9FT ADLT (ELECTROSURGICAL) ×3
ELECTRODE REM PT RTRN 9FT ADLT (ELECTROSURGICAL) ×1 IMPLANT
GLOVE BIO SURGEON STRL SZ 6.5 (GLOVE) ×2 IMPLANT
GLOVE BIO SURGEONS STRL SZ 6.5 (GLOVE) ×1
GLOVE BIOGEL M 6.5 STRL (GLOVE) ×3 IMPLANT
GLOVE BIOGEL PI IND STRL 6.5 (GLOVE) ×1 IMPLANT
GLOVE BIOGEL PI IND STRL 7.0 (GLOVE) ×3 IMPLANT
GLOVE BIOGEL PI INDICATOR 6.5 (GLOVE) ×2
GLOVE BIOGEL PI INDICATOR 7.0 (GLOVE) ×6
GLOVE ECLIPSE 6.5 STRL STRAW (GLOVE) ×3 IMPLANT
GOWN STRL REUS W/TWL LRG LVL3 (GOWN DISPOSABLE) ×6 IMPLANT
INST SET LAPROSCOPIC AP (KITS) ×3 IMPLANT
KIT TURNOVER KIT A (KITS) ×3 IMPLANT
MANIFOLD NEPTUNE II (INSTRUMENTS) ×3 IMPLANT
NEEDLE HYPO 18GX1.5 BLUNT FILL (NEEDLE) ×3 IMPLANT
NEEDLE HYPO 22GX1.5 SAFETY (NEEDLE) ×3 IMPLANT
NEEDLE INSUFFLATION 14GA 120MM (NEEDLE) ×3 IMPLANT
NS IRRIG 1000ML POUR BTL (IV SOLUTION) ×3 IMPLANT
PACK LAP CHOLE LZT030E (CUSTOM PROCEDURE TRAY) ×3 IMPLANT
PAD ARMBOARD 7.5X6 YLW CONV (MISCELLANEOUS) ×3 IMPLANT
RELOAD 45 VASCULAR/THIN (ENDOMECHANICALS) IMPLANT
RELOAD STAPLE TA45 3.5 REG BLU (ENDOMECHANICALS) ×6 IMPLANT
SET BASIN LINEN APH (SET/KITS/TRAYS/PACK) ×3 IMPLANT
SET TUBE IRRIG SUCTION NO TIP (IRRIGATION / IRRIGATOR) IMPLANT
SET TUBE SMOKE EVAC HIGH FLOW (TUBING) ×3 IMPLANT
SHEARS HARMONIC ACE PLUS 36CM (ENDOMECHANICALS) ×3 IMPLANT
SUT MNCRL AB 4-0 PS2 18 (SUTURE) ×6 IMPLANT
SUT VICRYL 0 UR6 27IN ABS (SUTURE) ×3 IMPLANT
SYR 20ML LL LF (SYRINGE) ×6 IMPLANT
SYS BAG RETRIEVAL 10MM (BASKET) ×1
SYSTEM BAG RETRIEVAL 10MM (BASKET) ×1 IMPLANT
TRAY FOLEY W/BAG SLVR 16FR (SET/KITS/TRAYS/PACK) ×2
TRAY FOLEY W/BAG SLVR 16FR ST (SET/KITS/TRAYS/PACK) ×1 IMPLANT
TROCAR ENDO BLADELESS 11MM (ENDOMECHANICALS) ×3 IMPLANT
TROCAR ENDO BLADELESS 12MM (ENDOMECHANICALS) ×3 IMPLANT
TROCAR XCEL NON-BLD 5MMX100MML (ENDOMECHANICALS) ×3 IMPLANT
WARMER LAPAROSCOPE (MISCELLANEOUS) ×3 IMPLANT
YANKAUER SUCT 12FT TUBE ARGYLE (SUCTIONS) ×3 IMPLANT

## 2020-02-19 NOTE — Progress Notes (Signed)
[facility]  Updated wife and patient may want to leave from PACU. Will see how he is feeling.  Curlene Labrum, MD [facility] [address] [place], [place] [id number] [phone] (office)

## 2020-02-19 NOTE — Op Note (Addendum)
[facility]  Date of Surgery: 02/19/2020  Admit Date: 02/19/2020   Performing Service: General  Surgeon(s) and Role:    * Virl Cagey, MD - Primary   Pre-operative Diagnosis: Acute Appendicitis  Post-operative Diagnosis: Acute Appendicitis  Procedure Performed: Laparoscopic Appendectomy   Surgeon: Lanell Matar. Constance Haw, MD   Assistant: No qualified resident was available.   Anesthesia: General   Findings:  The appendix was found to be inflamed. There were not signs of necrosis. There was not perforation. There was not abscess formation.   Estimated Blood Loss: Minimal   Specimens:  ID Type Source Tests Collected by Time Destination  1 : appendix Tissue PATH Appendix SURGICAL PATHOLOGY Virl Cagey, MD 12/15/6946 [id number]      Complications: None; patient tolerated the procedure well.   Disposition: PACU - hemodynamically stable.   Condition: stable   Indications: The patient presented with a 3 day history of right-sided abdominal pain. A CT revealed findings consistent with acute appendicitis.   Procedure Details  Prior to the procedure, the risks, benefits, complications, treatment options, and expected outcomes were discussed with the patient and/or family, including but not limited to the risk of bleeding, infection, finding of a normal appendix, and the need for conversion to an open procedure. There was concurrence with the proposed plan and informed consent was obtained. The patient was taken to the operating room, identified as Ricky Mack and the procedure verified as Laproscopic Appendectomy.    The patient was placed in the supine position and general anesthesia was induced, along with placement of orogastric tube, SCD's, and a Foley catheter. The abdomen was prepped and draped in a sterile fashion. The abdomen was entered with Veress technique in the supraumbilical incision (given that his cecum is high riding and the appendix is pointing to  right upper quadrant). Intraperitoneal placement was confirmed with saline drop, low entry pressures, and easy insufflation. A 11 mm optiview trocar was placed under direct visualization with a 0 degree scope. The 10 mm 0 degree scope was placed in the abdomen and no evidence of injury was identified. A 12 mm port was placed in the left lower quadrant of the abdomen after skin incision with trocar placement under direct vision. A careful evaluation of the entire abdomen was carried out. An additional 5 mm port was placed in the suprapubic area under direct vision.  The patient was placed in Trendelenburg and left lateral decubitus position. The small intestines were retracted in the cephalad and left lateral direction away from the pelvis and right lower quadrant. The patient was found to have an acute inflamed appendix. There was not evidence of perforation.   The appendix was carefully dissected. A window was made in the mesoappendix at the base of the appendix. The appendix was divided at its base using a standard endo-GIA stapler X2. Minimal to no appendiceal stump was left in place. The mesoappendix was taken with the harmonic energy device. The appendix was placed within an Endocatch specimen bag. There was no evidence of bleeding, leakage, or complication after division of the appendix.  Any remaining blood or pus was suctioned out from the abdomen, hemostasis was confirmed. The endocatch bag was removed via the 12 mm port, then the abdomen desufflated. The bag was intact. The appendix was passed off the field as a specimen.   The the 12 mm and 10 mm port sites were closed with a 0 Vicryl suture. The trocar site skin wounds were closed using  subcuticular 4-0 Monocryl suture and dermabond. The patient was then awakened from general anesthesia, extubated, and taken to PACU for recovery.   Instrument, sponge, and needle counts were correct at the conclusion of the case.   Curlene Labrum,  MD [facility] [address] [place], [place] [address] [phone]([id number])

## 2020-02-19 NOTE — Progress Notes (Signed)
Awaiting Dr Constance Haw to reassess for rx home meds

## 2020-02-19 NOTE — Anesthesia Procedure Notes (Signed)
Procedure Name: Intubation Date/Time: 02/19/2020 10:12 AM Performed by: Denese Killings, MD Pre-anesthesia Checklist: Patient identified, Emergency Drugs available, Suction available and Patient being monitored Patient Re-evaluated:Patient Re-evaluated prior to induction Oxygen Delivery Method: Circle system utilized Preoxygenation: Pre-oxygenation with 100% oxygen Induction Type: IV induction Grade View: Grade I Tube type: Oral Tube size: 7.5 mm Number of attempts: 1 Airway Equipment and Method: Stylet and Oral airway Placement Confirmation: ETT inserted through vocal cords under direct vision,  positive ETCO2 and breath sounds checked- equal and bilateral Secured at: 24 cm Tube secured with: Tape Dental Injury: Teeth and Oropharynx as per pre-operative assessment

## 2020-02-19 NOTE — Anesthesia Postprocedure Evaluation (Signed)
Anesthesia Post Note  Patient: Ricky Mack  Procedure(s) Performed: APPENDECTOMY LAPAROSCOPIC (N/A )  Patient location during evaluation: PACU Anesthesia Type: General Level of consciousness: awake and alert, oriented and sedated Pain management: pain level controlled Vital Signs Assessment: post-procedure vital signs reviewed and stable Respiratory status: spontaneous breathing Postop Assessment: no apparent nausea or vomiting Anesthetic complications: no   No complications documented.   Last Vitals:  Vitals:   02/19/20 1213 02/19/20 1254  BP: 137/81 128/81  Pulse: 87 72  Resp: 17 17  Temp: (!) 36.2 C   SpO2: 95% 94%    Last Pain:  Vitals:   02/19/20 1254  TempSrc:   PainSc: 3                  Mandie Crabbe C Demarius Archila

## 2020-02-19 NOTE — H&P (Signed)
[facility] History and Physical  Reason for Referral: Acute Appendicitis Referring Physician:  Dr. Dayna Barker   Chief Complaint    Abdominal Pain      Ricky Mack is a 34 y.o. male.  HPI: Ricky Mack is a very sweet 34 yo who comes in with a 3 day history of abdominal pain more in the right to mid abdomen who has associated nausea. His wife was worried Ricky Mack had a umbilical hernia and talked him into going to the ED.  Ricky Mack says the pain is sharp in nature and radiates up and over to the right from the umbilicus. Ricky Mack has had history of testicular cancer and had a right orchectomy and chemotherapy. Ricky Mack says that after Ricky Mack did have some "tingling nerve pain" around his heart and had a complete workup with cardiology and stress test that was normal. They thought it was something from his treatment and would get better. This was in another state. Ricky Mack works at a [employer] and is active and lifts.   Past Medical History:  Diagnosis Date  . Testicular cancer [facility])     Past Surgical History:  Procedure Laterality Date  . TESTICLE REMOVAL Right     History reviewed. No pertinent family history.  Social History   Tobacco Use  . Smoking status: Light Tobacco Smoker    Types: Cigarettes  . Smokeless tobacco: Never Used  Substance Use Topics  . Alcohol use: Yes    Comment: occ  . Drug use: Not Currently    Medications: I have reviewed the patient's current medications. Current Facility-Administered Medications  Medication Dose Route Frequency Provider Last Rate Last Admin  . acetaminophen (TYLENOL) tablet 650 mg  650 mg Oral Q6H PRN Virl Cagey, MD       Or  . acetaminophen (TYLENOL) suppository 650 mg  650 mg Rectal Q6H PRN Virl Cagey, MD      . diphenhydrAMINE (BENADRYL) 12.5 MG/5ML elixir 12.5 mg  12.5 mg Oral Q6H PRN Virl Cagey, MD       Or  . diphenhydrAMINE (BENADRYL) injection 12.5 mg  12.5 mg Intravenous Q6H PRN Virl Cagey, MD      .  enoxaparin (LOVENOX) injection 40 mg  40 mg Subcutaneous Q24H Virl Cagey, MD      . ketorolac (TORADOL) 30 MG/ML injection 30 mg  30 mg Intravenous Q6H PRN Virl Cagey, MD      . metoprolol tartrate (LOPRESSOR) injection 5 mg  5 mg Intravenous Q6H PRN Virl Cagey, MD      . morphine 2 MG/ML injection 2 mg  2 mg Intravenous Q3H PRN Virl Cagey, MD      . ondansetron (ZOFRAN-ODT) disintegrating tablet 4 mg  4 mg Oral Q6H PRN Virl Cagey, MD       Or  . ondansetron [facility]) injection 4 mg  4 mg Intravenous Q6H PRN Virl Cagey, MD      . oxyCODONE (Oxy IR/ROXICODONE) immediate release tablet 5-10 mg  5-10 mg Oral Q4H PRN Virl Cagey, MD      . simethicone (MYLICON) chewable tablet 40 mg  40 mg Oral Q6H PRN Virl Cagey, MD       Current Outpatient Medications  Medication Sig Dispense Refill Last Dose  . ibuprofen (ADVIL) 800 MG tablet Take 1 tablet (800 mg total) by mouth every 8 (eight) hours as needed for moderate pain. (Patient not taking: Reported on 02/19/2020) 30 tablet  0 Not Taking at Unknown time  . methocarbamol (ROBAXIN) 500 MG tablet Take 1 tablet (500 mg total) by mouth 2 (two) times daily. (Patient not taking: Reported on 02/19/2020) 30 tablet 0 Not Taking at Unknown time  . predniSONE (DELTASONE) 10 MG tablet 6,6,5,5,4,4,3,3,2,2,1,1 taper (Patient not taking: Reported on 02/19/2020) 42 tablet 0 Not Taking at Unknown time   Allergies  Allergen Reactions  . Keflex [Cephalexin] Shortness Of Breath and Swelling    irritable   Received zosyn in the ED without issues.    ROS:  A comprehensive review of systems was negative except for: Gastrointestinal: positive for abdominal pain and nausea  Blood pressure 110/79, pulse 65, temperature 98.8 F (37.1 C), temperature source Oral, resp. rate 18, height 6\' 2"  (1.88 m), weight (!) 145.4 kg, SpO2 98 %. Physical Exam Vitals reviewed.  Constitutional:      Appearance: Ricky Mack is well-developed.   HENT:     Head: Normocephalic.  Eyes:     Extraocular Movements: Extraocular movements intact.  Cardiovascular:     Rate and Rhythm: Normal rate.  Pulmonary:     Effort: Pulmonary effort is normal.  Abdominal:     Palpations: Abdomen is soft.     Tenderness: There is abdominal tenderness in the right upper quadrant and periumbilical area.  Musculoskeletal:     Comments: Moves all extremities  Skin:    General: Skin is warm.  Neurological:     General: No focal deficit present.     Mental Status: Ricky Mack is alert and oriented to person, place, and time.  Psychiatric:        Mood and Affect: Mood normal.        Behavior: Behavior normal.     Results: Results for orders placed or performed during the hospital encounter of 02/19/20 (from the past 48 hour(s))  Urinalysis, Routine w reflex microscopic Urine, Clean Catch     Status: Abnormal   Collection Time: 02/19/20  1:24 AM  Result Value Ref Range   Color, Urine YELLOW YELLOW   APPearance CLEAR CLEAR   Specific Gravity, Urine >1.046 (H) 1.005 - 1.030   pH 5.0 5.0 - 8.0   Glucose, UA NEGATIVE NEGATIVE mg/dL   Hgb urine dipstick NEGATIVE NEGATIVE   Bilirubin Urine NEGATIVE NEGATIVE   Ketones, ur NEGATIVE NEGATIVE mg/dL   Protein, ur NEGATIVE NEGATIVE mg/dL   Nitrite NEGATIVE NEGATIVE   Leukocytes,Ua NEGATIVE NEGATIVE    Comment: Performed at [facility], [address], [place], [place] [address]  Lipase, blood     Status: None   Collection Time: 02/19/20  2:25 AM  Result Value Ref Range   Lipase 29 11 - 51 U/L    Comment: Performed at [facility], [address] [place], [place] [address]  Comprehensive metabolic panel     Status: Abnormal   Collection Time: 02/19/20  2:25 AM  Result Value Ref Range   Sodium 138 135 - 145 mmol/L   Potassium 3.7 3.5 - 5.1 mmol/L   Chloride 106 98 - 111 mmol/L   CO2 25 22 - 32 mmol/L   Glucose, Bld 102 (H) 70 - 99 mg/dL    Comment: Glucose reference range applies only to samples taken  after fasting for at least 8 hours.   BUN 18 6 - 20 mg/dL   Creatinine, Ser 1.04 0.61 - 1.24 mg/dL   Calcium 9.3 8.9 - 10.3 mg/dL   Total Protein 7.8 6.5 - 8.1 g/dL   Albumin 4.4 3.5 -  5.0 g/dL   AST 22 15 - 41 U/L   ALT 35 0 - 44 U/L   Alkaline Phosphatase 45 38 - 126 U/L   Total Bilirubin 0.4 0.3 - 1.2 mg/dL   GFR calc non Af Amer >60 >60 mL/min   GFR calc Af Amer >60 >60 mL/min   Anion gap 7 5 - 15    Comment: Performed at [facility], [address] [place], [place] [address]  CBC     Status: None   Collection Time: 02/19/20  2:25 AM  Result Value Ref Range   WBC 9.0 4.0 - 10.5 K/uL   RBC 5.37 4.22 - 5.81 MIL/uL   Hemoglobin 15.4 13.0 - 17.0 g/dL   HCT 46.3 39 - 52 %   MCV 86.2 80.0 - 100.0 fL   MCH 28.7 26.0 - 34.0 pg   MCHC 33.3 30.0 - 36.0 g/dL   RDW 13.2 11.5 - 15.5 %   Platelets 260 150 - 400 K/uL   nRBC 0.0 0.0 - 0.2 %    Comment: Performed at [facility], [address], [place], [place] [address]  SARS Coronavirus 2 by RT PCR (hospital order, performed in [facility] hospital lab) Nasopharyngeal Nasopharyngeal Swab     Status: None   Collection Time: 02/19/20  4:50 AM   Specimen: Nasopharyngeal Swab  Result Value Ref Range   SARS Coronavirus 2 NEGATIVE NEGATIVE    Comment: (NOTE) SARS-CoV-2 target nucleic acids are NOT DETECTED.  The SARS-CoV-2 RNA is generally detectable in upper and lower respiratory specimens during the acute phase of infection. The lowest concentration of SARS-CoV-2 viral copies this assay can detect is 250 copies / mL. A negative result does not preclude SARS-CoV-2 infection and should not be used as the sole basis for treatment or other patient management decisions.  A negative result may occur with improper specimen collection / handling, submission of specimen other than nasopharyngeal swab, presence of viral mutation(s) within the areas targeted by this assay, and inadequate number of viral copies (<250 copies / mL). A negative  result must be combined with clinical observations, patient history, and epidemiological information.  Fact Sheet for Patients:   StrictlyIdeas.no  Fact Sheet for Healthcare Providers: BankingDealers.co.za  This test is not yet approved or  cleared by the [place] FDA and has been authorized for detection and/or diagnosis of SARS-CoV-2 by FDA under an Emergency Use Authorization (EUA).  This EUA will remain in effect (meaning this test can be used) for the duration of the COVID-19 declaration under Section 564(b)(1) of the Act, 21 U.S.C. section 360bbb-3(b)(1), unless the authorization is terminated or revoked sooner.  Performed at [facility], [address], [place], [place] [address]    Personally reviewed- thickened dilated appendix curling up toward the right upper quadrant  CT ABDOMEN PELVIS W CONTRAST  Result Date: 02/19/2020 CLINICAL DATA:  Midline abdominal pain EXAM: CT ABDOMEN AND PELVIS WITH CONTRAST TECHNIQUE: Multidetector CT imaging of the abdomen and pelvis was performed using the standard protocol following bolus administration of intravenous contrast. CONTRAST:  173mL OMNIPAQUE IOHEXOL 300 MG/ML  SOLN COMPARISON:  August 14, 2019 FINDINGS: Lower chest: The visualized heart size within normal limits. No pericardial fluid/thickening. No hiatal hernia. The visualized portions of the lungs are clear. Hepatobiliary: The liver is normal in density without focal abnormality.The main portal vein is patent. No evidence of calcified gallstones, gallbladder wall thickening or biliary dilatation. Pancreas: Unremarkable. No pancreatic ductal dilatation or surrounding inflammatory changes.  Spleen: Normal in size without focal abnormality. Adrenals/Urinary Tract: Both adrenal glands appear normal. The kidneys and collecting system appear normal without evidence of urinary tract calculus or hydronephrosis. Bladder is unremarkable.  Stomach/Bowel: The stomach, small bowel, and colon are normal in appearance. No inflammatory changes, wall thickening, or obstructive findings.There is a mildly dilated thick-walled appendix seen within the right upper quadrant measuring up to 1.3 cm in transverse dimension. Mild fat stranding changes are seen surrounding the appendix. There is a tiny calcifications seen within the appendiceal tip. Scattered small lymph nodes are seen within the right upper quadrant. No periappendiceal loculated fluid collections or free air. Vascular/Lymphatic: There are no enlarged mesenteric, retroperitoneal, or pelvic lymph nodes. No significant vascular findings are present. Reproductive: The prostate is unremarkable. Other: No evidence of abdominal wall mass or hernia. Musculoskeletal: No acute or significant osseous findings. IMPRESSION: Findings suggestive of acute appendicitis. No periappendiceal free air or loculated fluid collections Electronically Signed   By: Prudencio Pair M.D.   On: 02/19/2020 03:59     Assessment & Plan:  Ricky Mack is a 34 y.o. male with acute appendicitis on CT. Discussed laparoscopic appendectomy possible open risk of bleeding, infection, injury to other organs, need for open procedure.   -COVID negative -EKG ordered preop given reported history for baseline   All questions were answered to the satisfaction of the patient and family.    Virl Cagey 02/19/2020, 9:17 AM

## 2020-02-19 NOTE — ED Triage Notes (Signed)
Pt with c/o midline abdominal pain x 2 days. Pt states he also has sore throat, runny nose, and congestion.

## 2020-02-19 NOTE — Anesthesia Preprocedure Evaluation (Addendum)
Anesthesia Evaluation  Patient identified by MRN, date of birth, ID band Patient awake    Reviewed: Allergy & Precautions, NPO status , Patient's Chart, lab work & pertinent test results  Airway        Dental  (+) Dental Advisory Given   Pulmonary Current Smoker and Patient abstained from smoking.,    Pulmonary exam normal breath sounds clear to auscultation       Cardiovascular Exercise Tolerance: Good Normal cardiovascular exam Rhythm:Regular Rate:Normal     Neuro/Psych negative neurological ROS     GI/Hepatic GERD  Controlled,(+)     substance abuse  alcohol use,   Endo/Other  Morbid obesity  Renal/GU    Testicular cancer    Musculoskeletal   Abdominal   Peds  Hematology   Anesthesia Other Findings   Reproductive/Obstetrics negative OB ROS                            Anesthesia Physical Anesthesia Plan  ASA: III and emergent  Anesthesia Plan: General   Post-op Pain Management:    Induction: Intravenous  PONV Risk Score and Plan: 3 and Ondansetron, Dexamethasone and Midazolam  Airway Management Planned: Oral ETT  Additional Equipment:   Intra-op Plan:   Post-operative Plan: Extubation in OR  Informed Consent: I have reviewed the patients History and Physical, chart, labs and discussed the procedure including the risks, benefits and alternatives for the proposed anesthesia with the patient or authorized representative who has indicated his/her understanding and acceptance.     Dental advisory given  Plan Discussed with: CRNA and Surgeon  Anesthesia Plan Comments:         Anesthesia Quick Evaluation

## 2020-02-19 NOTE — Discharge Summary (Signed)
Physician Discharge Summary  Patient ID: Ricky Mack MRN: [id number] DOB/AGE: [birth year] 35 y.o.  Admit date: 02/19/2020 Discharge date: 02/19/2020   Admission Diagnoses: Acute appendicitis   Discharge Diagnoses:  Principal Problem:   Acute appendicitis   Discharged Condition: good  Hospital Course: Mr. Paschal is a 34 yo with acute appendicitis who was brought into the hospital for laparoscopic appendectomy. He had this on 02/19/2020 and was discharged from PACU in good condition.   Consults: None  Significant Diagnostic Studies: CT - acute appendicitis   Treatments: IV hydration, antibiotics: Zosyn/ Intraoperative Invanz  and Laparoscopic appendectomy   Discharge Exam: Blood pressure 128/81, pulse 72, temperature (!) 97.2 F (36.2 C), temperature source Oral, resp. rate 17, height 6\' 2"  (1.88 m), weight (!) 145.4 kg, SpO2 94 %.   Disposition: Discharge disposition: 01-Home or Self Care       Discharge Instructions    Call MD for:  difficulty breathing, headache or visual disturbances   Complete by: As directed    Call MD for:  extreme fatigue   Complete by: As directed    Call MD for:  persistant dizziness or light-headedness   Complete by: As directed    Call MD for:  persistant nausea and vomiting   Complete by: As directed    Call MD for:  redness, tenderness, or signs of infection (pain, swelling, redness, odor or green/yellow discharge around incision site)   Complete by: As directed    Call MD for:  severe uncontrolled pain   Complete by: As directed    Call MD for:  temperature >100.4   Complete by: As directed    Increase activity slowly   Complete by: As directed      Allergies as of 02/19/2020      Reactions   Keflex [cephalexin] Shortness Of Breath, Swelling   irritable      Medication List    STOP taking these medications   methocarbamol 500 MG tablet Commonly known as: ROBAXIN   predniSONE 10 MG tablet Commonly known as: DELTASONE     TAKE  these medications   ibuprofen 800 MG tablet Commonly known as: ADVIL Take 1 tablet (800 mg total) by mouth every 8 (eight) hours as needed for moderate pain.   ondansetron 4 MG disintegrating tablet Commonly known as: ZOFRAN-ODT Take 1 tablet (4 mg total) by mouth every 6 (six) hours as needed for nausea or vomiting.   oxyCODONE 5 MG immediate release tablet Commonly known as: Oxy IR/ROXICODONE Take 1 tablet (5 mg total) by mouth every 4 (four) hours as needed for severe pain or breakthrough pain.       Follow-up Information    Virl Cagey, MD On 03/09/2020.   Specialty: General Surgery Why: post op phone call; if you need to be seen in person please call the office  Contact information: [address] [place] [address] [phone]               Signed: Virl Cagey 02/19/2020, 12:56 PM

## 2020-02-19 NOTE — Transfer of Care (Signed)
Immediate Anesthesia Transfer of Care Note  Patient: Ricky Mack  Procedure(s) Performed: APPENDECTOMY LAPAROSCOPIC (N/A )  Patient Location: PACU  Anesthesia Type:General  Level of Consciousness: awake and sedated  Airway & Oxygen Therapy: Patient Spontanous Breathing and Patient connected to nasal cannula oxygen  Post-op Assessment: Report given to RN and Post -op Vital signs reviewed and stable  Post vital signs: Reviewed and stable  Last Vitals:  Vitals Value Taken Time  BP 151/85 02/19/20 1118  Temp 35.9 C 02/19/20 1117  Pulse 92 02/19/20 1124  Resp 16 02/19/20 1124  SpO2 98 % 02/19/20 1124  Vitals shown include unvalidated device data.  Last Pain:  Vitals:   02/19/20 0441  TempSrc:   PainSc: 3          Complications: No complications documented.

## 2020-02-19 NOTE — ED Provider Notes (Signed)
[facility] EMERGENCY DEPARTMENT Provider Note   CSN: [id number] Arrival date & time: 02/19/20  0059     History Chief Complaint  Patient presents with  . Abdominal Pain    Ricky Mack is a 34 y.o. male.   Abdominal Pain Pain location:  Periumbilical and epigastric Pain quality: sharp   Pain radiates to:  Does not radiate Pain severity:  Mild Duration:  1 day Timing:  Constant Chronicity:  New Relieved by:  Nothing Worsened by:  Nothing Ineffective treatments:  None tried Associated symptoms: nausea   Associated symptoms: no dysuria, no fever and no shortness of breath        Past Medical History:  Diagnosis Date  . Testicular cancer ([place])     There are no problems to display for this patient.   Past Surgical History:  Procedure Laterality Date  . TESTICLE REMOVAL Right        History reviewed. No pertinent family history.  Social History   Tobacco Use  . Smoking status: Light Tobacco Smoker    Types: Cigarettes  . Smokeless tobacco: Never Used  Substance Use Topics  . Alcohol use: Yes    Comment: occ  . Drug use: Not Currently    Home Medications Prior to Admission medications   Medication Sig Start Date End Date Taking? Authorizing Provider  ibuprofen (ADVIL) 800 MG tablet Take 1 tablet (800 mg total) by mouth every 8 (eight) hours as needed for moderate pain. 08/14/19   Rancour, Annie Main, MD  methocarbamol (ROBAXIN) 500 MG tablet Take 1 tablet (500 mg total) by mouth 2 (two) times daily. 08/14/19   Rancour, Annie Main, MD  predniSONE (DELTASONE) 10 MG tablet 6,6,5,5,4,4,3,3,2,2,1,1 taper 11/30/19   Fransico Meadow, PA-C    Allergies    Keflex [cephalexin]  Review of Systems   Review of Systems  Constitutional: Negative for fever.  Respiratory: Negative for shortness of breath.   Gastrointestinal: Positive for abdominal pain and nausea.  Genitourinary: Negative for dysuria.  All other systems reviewed and are negative.   Physical  Exam Updated Vital Signs BP 126/77 (BP Location: Right Arm)   Pulse 76   Temp 98.8 F (37.1 C) (Oral)   Resp 16   Ht 6\' 2"  (1.88 m)   Wt (!) 145.4 kg   SpO2 97%   BMI 41.16 kg/m   Physical Exam Vitals and nursing note reviewed.  Constitutional:      Appearance: He is well-developed.  HENT:     Head: Normocephalic and atraumatic.     Nose: No congestion or rhinorrhea.     Mouth/Throat:     Mouth: Mucous membranes are moist.     Pharynx: Oropharynx is clear.  Eyes:     Pupils: Pupils are equal, round, and reactive to light.  Cardiovascular:     Rate and Rhythm: Normal rate.  Pulmonary:     Effort: Pulmonary effort is normal. No respiratory distress.  Abdominal:     General: There is no distension.     Palpations: There is no mass.     Tenderness: There is abdominal tenderness (epigastric and periumbilical).  Musculoskeletal:        General: Normal range of motion.     Cervical back: Normal range of motion.  Skin:    General: Skin is warm and dry.  Neurological:     General: No focal deficit present.     Mental Status: He is alert.     ED Results / Procedures / Treatments  Labs (all labs ordered are listed, but only abnormal results are displayed) Labs Reviewed  COMPREHENSIVE METABOLIC PANEL - Abnormal; Notable for the following components:      Result Value   Glucose, Bld 102 (*)    All other components within normal limits  LIPASE, BLOOD  CBC  URINALYSIS, ROUTINE W REFLEX MICROSCOPIC    EKG None  Radiology No results found.  Procedures Procedures (including critical care time)  Medications Ordered in ED Medications - No data to display  ED Course  I have reviewed the triage vital signs and the nursing notes.  Pertinent labs & imaging results that were available during my care of the patient were reviewed by me and considered in my medical decision making (see chart for details).    MDM Rules/Calculators/A&P                           Abdominal pain, turns out to be appendicitis. D/w Dr. Constance Haw, will admit. abx given.   Final Clinical Impression(s) / ED Diagnoses Final diagnoses:  None    Rx / DC Orders ED Discharge Orders    None       Mineola Duan, Corene Cornea, MD 02/19/20 [phone]

## 2020-02-19 NOTE — Discharge Instructions (Signed)
Monitored Anesthesia Care, Care After These instructions provide you with information about caring for yourself after your procedure. Your health care provider may also give you more specific instructions. Your treatment has been planned according to current medical practices, but problems sometimes occur. Call your health care provider if you have any problems or questions after your procedure. What can I expect after the procedure? After your procedure, you may:  Feel sleepy for several hours.  Feel clumsy and have poor balance for several hours.  Feel forgetful about what happened after the procedure.  Have poor judgment for several hours.  Feel nauseous or vomit.  Have a sore throat if you had a breathing tube during the procedure. Follow these instructions at home: For at least 24 hours after the procedure:      Have a responsible adult stay with you. It is important to have someone help care for you until you are awake and alert.  Rest as needed.  Do not: ? Participate in activities in which you could fall or become injured. ? Drive. ? Use heavy machinery. ? Drink alcohol. ? Take sleeping pills or medicines that cause drowsiness. ? Make important decisions or sign legal documents. ? Take care of children on your own. Eating and drinking  Follow the diet that is recommended by your health care provider.  If you vomit, drink water, juice, or soup when you can drink without vomiting.  Make sure you have little or no nausea before eating solid foods. General instructions  Take over-the-counter and prescription medicines only as told by your health care provider.  If you have sleep apnea, surgery and certain medicines can increase your risk for breathing problems. Follow instructions from your health care provider about wearing your sleep device: ? Anytime you are sleeping, including during daytime naps. ? While taking prescription pain medicines, sleeping medicines,  or medicines that make you drowsy.  If you smoke, do not smoke without supervision.  Keep all follow-up visits as told by your health care provider. This is important. Contact a health care provider if:  You keep feeling nauseous or you keep vomiting.  You feel light-headed.  You develop a rash.  You have a fever. Get help right away if:  You have trouble breathing. Summary  For several hours after your procedure, you may feel sleepy and have poor judgment.  Have a responsible adult stay with you for at least 24 hours or until you are awake and alert. This information is not intended to replace advice given to you by your health care provider. Make sure you discuss any questions you have with your health care provider. Document Revised: 09/29/2017 Document Reviewed: 10/22/2015 Elsevier Patient Education  [place].  Discharge Laparoscopic Surgery Instructions:  Common Complaints: Right shoulder pain is common after laparoscopic surgery. This is secondary to the gas used in the surgery being trapped under the diaphragm.  Walk to help your body absorb the gas. This will improve in a few days. Pain at the port sites are common, especially the larger port sites. This will improve with time.  Some nausea is common and poor appetite. The main goal is to stay hydrated the first few days after surgery.   Diet/ Activity: Diet as tolerated. You may not have an appetite, but it is important to stay hydrated. Drink 64 ounces of water a day. Your appetite will return with time.  Shower per your regular routine daily.  Do not take hot showers. Take  warm showers that are less than 10 minutes. Rest and listen to your body, but do not remain in bed all day.  Walk everyday for at least 15-20 minutes. Deep cough and move around every 1-2 hours in the first few days after surgery.  Do not lift > 10 lbs, perform excessive bending, pushing, pulling, squatting for 1-2 weeks after surgery.    Do not pick at the dermabond glue on your incision sites.  This glue film will remain in place for 1-2 weeks and will start to peel off.  Do not place lotions or balms on your incision unless instructed to specifically by Dr. Constance Haw.   Pain Expectations and Narcotics: -After surgery you will have pain associated with your incisions and this is normal. The pain is muscular and nerve pain, and will get better with time. -You are encouraged and expected to take non narcotic medications like tylenol and ibuprofen (when able) to treat pain as multiple modalities can aid with pain treatment. -Narcotics are only used when pain is severe or there is breakthrough pain. -You are not expected to have a pain score of 0 after surgery, as we cannot prevent pain. A pain score of 3-4 that allows you to be functional, move, walk, and tolerate some activity is the goal. The pain will continue to improve over the days after surgery and is dependent on your surgery. -Due to [place] law, we are only able to give a certain amount of pain medication to treat post operative pain, and we only give additional narcotics on a patient by patient basis.  -For most laparoscopic surgery, studies have shown that the majority of patients only need 10-15 narcotic pills, and for open surgeries most patients only need 15-20.   -Having appropriate expectations of pain and knowledge of pain management with non narcotics is important as we do not want anyone to become addicted to narcotic pain medication.  -Using ice packs in the first 48 hours and heating pads after 48 hours, wearing an abdominal binder (when recommended), and using over the counter medications are all ways to help with pain management.   -Simple acts like meditation and mindfulness practices after surgery can also help with pain control and research has proven the benefit of these practices.  Medication: Take tylenol and ibuprofen as needed for pain control, alternating  every 4-6 hours.  Example:  Tylenol 1000mg  @ 6am, 12noon, 6pm, 39midnight (Do not exceed 4000mg  of tylenol a day). Ibuprofen 800mg  @ 9am, 3pm, 9pm, 3am (Do not exceed 3600mg  of ibuprofen a day).  Take Roxicodone for breakthrough pain every 4 hours.  Take Colace for constipation related to narcotic pain medication. If you do not have a bowel movement in 2 days, take Miralax over the counter.  Drink plenty of water to also prevent constipation.   Contact Information: If you have questions or concerns, please call our office, [phone], Monday- Thursday 8AM-5PM and Friday 8AM-12Noon.  If it is after hours or on the weekend, please call [facility], [phone], and ask to speak to the surgeon on call for Dr. Constance Haw at [facility].   Laparoscopic Appendectomy, Adult, Care After This sheet gives you information about how to care for yourself after your procedure. Your doctor may also give you more specific instructions. If you have problems or questions, contact your doctor. What can I expect after the procedure? After the procedure, it is common to have:  Little energy for normal activities.  Mild pain in the  area where the cuts from surgery (incisions) were made.  Trouble pooping (constipation). This can be caused by: ? Pain medicine. ? A lack of activity. Follow these instructions at home: Medicines  Take over-the-counter and prescription medicines only as told by your doctor.  If you were prescribed an antibiotic medicine, take it as told by your doctor. Do not stop taking it even if you start to feel better.  Do not drive or use heavy machinery while taking prescription pain medicine.  Ask your doctor if the medicine you are taking can cause trouble pooping. You may need to take steps to prevent or treat trouble pooping: ? Drink enough fluid to keep your pee (urine) pale yellow. ? Take over-the-counter or prescription medicines. ? Eat foods that are high in fiber. These  include beans, whole grains, and fresh fruits and vegetables. ? Limit foods that are high in fat and sugar. These include fried or sweet foods. Incision care   Follow instructions from your doctor about how to take care of your cuts from surgery. Make sure you: ? Wash your hands with soap and water before and after you change your bandage (dressing). If you cannot use soap and water, use hand sanitizer. ? Change your bandage as told by your doctor. ? Leave stitches (sutures), skin glue, or skin tape (adhesive) strips in place. They may need to stay in place for 2 weeks or longer. If tape strips get loose and curl up, you may trim the loose edges. Do not remove tape strips completely unless your doctor says it is okay.  Check your cuts from surgery every day for signs of infection. Check for: ? Redness, swelling, or pain. ? Fluid or blood. ? Warmth. ? Pus or a bad smell. Bathing  Keep your cuts from surgery clean and dry. Clean them as told by your doctor. To do this: 1. Gently wash the cuts with soap and water. 2. Rinse the cuts with water to remove all soap. 3. Pat the cuts dry with a clean towel. Do not rub the cuts. Do not take baths, swim, or use a hot tub for 2 weeks, or until your doctor says it is okay.  You may shower. Activity   Do not drive for 24 hours if you were given a medicine to help you relax (sedative) during your procedure.  Rest after the procedure. Return to your normal activities as told by your doctor. Ask your doctor what activities are safe for you.  For 1-2 weeks, or for as long as told by your doctor: ? Do not lift anything that is heavier than 10 lb (4.5 kg). ? Do not play contact sports. General instructions  If you were sent home with a drain, follow instructions from your doctor on how to care for it.  Take deep breaths. This helps to keep your lungs from getting an infection (pneumonia).  Keep all follow-up visits as told by your doctor. This is  important. Contact a doctor if:  You have redness, swelling, or pain around a cut from surgery.  You have fluid or blood coming from a cut.  Your cut feels warm to the touch.  You have pus or a bad smell coming from a cut or a bandage.  The edges of a cut break open after the stitches have been taken out.  You have pain in your shoulders that gets worse.  You feel dizzy or you pass out (faint).  You have shortness of breath.  You keep feeling sick to your stomach (nauseous).  You keep throwing up (vomiting).  You get watery poop (diarrhea) or you cannot control your poop.  You lose your appetite.  You have swelling or pain in your legs.  You get a rash. Get help right away if:  You have a fever.  You have trouble breathing.  You have sharp pains in your chest. Summary  After the procedure, it is common to have low energy, mild pain, and trouble pooping.  Infection is a common problem after this procedure. Follow your doctor's instructions about caring for yourself after the procedure.  Rest after the procedure. Return to your normal activities as told by your doctor.  Contact your doctor if you see signs of infection around your cuts from surgery, or you get short of breath. Get help right away if you have a fever, chest pain, or trouble breathing. This information is not intended to replace advice given to you by your health care provider. Make sure you discuss any questions you have with your health care provider. Document Revised: 01/01/2018 Document Reviewed: 01/01/2018 Elsevier Patient Education  2020 [employer].

## 2020-02-19 NOTE — Progress Notes (Signed)
[facility]  CT with acute appendicitis. Plan for OR later this AM.  Curlene Labrum, MD [facility] [place], [place] [address] [phone] (office)

## 2020-02-22 ENCOUNTER — Encounter (HOSPITAL_COMMUNITY): Payer: Self-pay | Admitting: General Surgery

## 2020-02-22 LAB — SURGICAL PATHOLOGY

## 2020-03-09 ENCOUNTER — Telehealth (INDEPENDENT_AMBULATORY_CARE_PROVIDER_SITE_OTHER): Payer: Medicaid Other | Admitting: General Surgery

## 2020-03-09 DIAGNOSIS — K3580 Unspecified acute appendicitis: Secondary | ICD-10-CM

## 2020-03-09 NOTE — Progress Notes (Signed)
[facility]  I am calling the patient for post operative evaluation. This is not a billable encounter as it is under the [place] charges for the surgery.  The patient had a laparoscopic appendectomy on 8/7. The patient did not answer and a voicemail was left detailing the pathology report and activity and diet as tolerated. I instructed him to call the office with questions or concerns.   Pathology: Clinical History: acute appendicitis   FINAL MICROSCOPIC DIAGNOSIS:   A. APPENDIX, APPENDECTOMY:  - Acute appendicitis   Will see the patient PRN.   Curlene Labrum, MD [facility] [address] [place], [place] [address] [phone] (office)

## 2020-03-21 ENCOUNTER — Encounter: Payer: Self-pay | Admitting: Emergency Medicine

## 2020-03-21 ENCOUNTER — Other Ambulatory Visit: Payer: Self-pay

## 2020-03-21 ENCOUNTER — Ambulatory Visit (INDEPENDENT_AMBULATORY_CARE_PROVIDER_SITE_OTHER): Payer: Medicaid Other

## 2020-03-21 ENCOUNTER — Ambulatory Visit
Admission: EM | Admit: 2020-03-21 | Discharge: 2020-03-21 | Disposition: A | Discharge status: Home / Self Care | Payer: Medicaid Other | Attending: Emergency Medicine | Admitting: Emergency Medicine

## 2020-03-21 DIAGNOSIS — T5991XA Toxic effect of unspecified gases, fumes and vapors, accidental (unintentional), initial encounter: Secondary | ICD-10-CM

## 2020-03-21 DIAGNOSIS — R0789 Other chest pain: Secondary | ICD-10-CM | POA: Diagnosis not present

## 2020-03-21 MED ORDER — ALBUTEROL SULFATE (2.5 MG/3ML) 0.083% IN NEBU
2.5000 mg | INHALATION_SOLUTION | Freq: Four times a day (QID) | RESPIRATORY_TRACT | 12 refills | Status: AC | PRN
Start: 2020-03-21 — End: ?

## 2020-03-21 MED ORDER — NEBULIZER SYSTEM ALL-IN-ONE MISC: 1.0000 | Freq: Three times a day (TID) | 0 refills | Status: AC | PRN

## 2020-03-21 NOTE — Discharge Instructions (Addendum)
Nebulizer with albuterol were prescribed/use as directed Follow-up with PCP Return or go to ED for worsening of symptoms
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
## 2020-04-19 ENCOUNTER — Other Ambulatory Visit: Payer: Self-pay

## 2020-04-19 DIAGNOSIS — R110 Nausea: Secondary | ICD-10-CM | POA: Insufficient documentation

## 2020-04-19 DIAGNOSIS — Z8547 Personal history of malignant neoplasm of testis: Secondary | ICD-10-CM | POA: Insufficient documentation

## 2020-04-19 DIAGNOSIS — S0993XA Unspecified injury of face, initial encounter: Secondary | ICD-10-CM | POA: Diagnosis present

## 2020-04-19 DIAGNOSIS — S0240EA Zygomatic fracture, right side, initial encounter for closed fracture: Secondary | ICD-10-CM | POA: Diagnosis not present

## 2020-04-19 DIAGNOSIS — F17210 Nicotine dependence, cigarettes, uncomplicated: Secondary | ICD-10-CM | POA: Diagnosis not present

## 2020-04-19 DIAGNOSIS — S060X0A Concussion without loss of consciousness, initial encounter: Secondary | ICD-10-CM | POA: Diagnosis not present
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
## 2020-04-20 ENCOUNTER — Other Ambulatory Visit: Payer: Self-pay

## 2020-04-20 ENCOUNTER — Encounter (HOSPITAL_COMMUNITY): Payer: Self-pay | Admitting: Emergency Medicine

## 2020-04-20 ENCOUNTER — Emergency Department (HOSPITAL_COMMUNITY): Payer: Medicaid Other

## 2020-04-20 ENCOUNTER — Emergency Department (HOSPITAL_COMMUNITY)
Admission: EM | Admit: 2020-04-20 | Discharge: 2020-04-20 | Disposition: A | Discharge status: Home / Self Care | Payer: Medicaid Other | Attending: Emergency Medicine | Admitting: Emergency Medicine

## 2020-04-20 DIAGNOSIS — S02402A Zygomatic fracture, unspecified, initial encounter for closed fracture: Secondary | ICD-10-CM

## 2020-04-20 DIAGNOSIS — S060X0A Concussion without loss of consciousness, initial encounter: Secondary | ICD-10-CM

## 2020-04-20 MED ORDER — ONDANSETRON HCL 4 MG/2ML IJ SOLN
4.0000 mg | Freq: Once | INTRAMUSCULAR | Status: AC
  Administered 2020-04-20: 4 mg via INTRAVENOUS
  Filled 2020-04-20: qty 2

## 2020-04-20 MED ORDER — HYDROCODONE-ACETAMINOPHEN 5-325 MG PO TABS: 1.0000 | ORAL_TABLET | ORAL | 0 refills | Status: AC | PRN

## 2020-04-20 MED ORDER — MORPHINE SULFATE (PF) 4 MG/ML IV SOLN
4.0000 mg | Freq: Once | INTRAVENOUS | Status: AC
  Administered 2020-04-20: 4 mg via INTRAVENOUS
  Filled 2020-04-20: qty 1
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
